# Patient Record
Sex: Male | Born: 1985 | Race: Black or African American | Hispanic: No | Marital: Single | Smoking: Never smoker
Health system: Southern US, Community
[De-identification: ages and names within clinical notes are randomized; demographics above are authoritative.]

## PROBLEM LIST (undated history)

## (undated) DIAGNOSIS — F32A Depression, unspecified: Secondary | ICD-10-CM

## (undated) DIAGNOSIS — F840 Autistic disorder: Secondary | ICD-10-CM

## (undated) DIAGNOSIS — F909 Attention-deficit hyperactivity disorder, unspecified type: Secondary | ICD-10-CM

## (undated) DIAGNOSIS — Z973 Presence of spectacles and contact lenses: Secondary | ICD-10-CM

## (undated) DIAGNOSIS — J45909 Unspecified asthma, uncomplicated: Secondary | ICD-10-CM

## (undated) DIAGNOSIS — Z8679 Personal history of other diseases of the circulatory system: Secondary | ICD-10-CM

## (undated) DIAGNOSIS — R569 Unspecified convulsions: Secondary | ICD-10-CM

## (undated) DIAGNOSIS — I10 Essential (primary) hypertension: Secondary | ICD-10-CM

## (undated) DIAGNOSIS — F329 Major depressive disorder, single episode, unspecified: Secondary | ICD-10-CM

## (undated) DIAGNOSIS — N62 Hypertrophy of breast: Secondary | ICD-10-CM

## (undated) HISTORY — DX: Major depressive disorder, single episode, unspecified: F32.9

## (undated) HISTORY — PX: CIRCUMCISION: SHX1350

## (undated) HISTORY — DX: Presence of spectacles and contact lenses: Z97.3

## (undated) HISTORY — PX: MEATOTOMY: SHX5133

## (undated) HISTORY — DX: Depression, unspecified: F32.A

## (undated) HISTORY — DX: Attention-deficit hyperactivity disorder, unspecified type: F90.9

## (undated) HISTORY — DX: Unspecified asthma, uncomplicated: J45.909

---
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
## 2005-04-13 ENCOUNTER — Encounter: Admission: RE | Admit: 2005-04-13 | Discharge: 2005-04-13 | Payer: Self-pay | Admitting: Sports Medicine

## 2005-04-13 ENCOUNTER — Ambulatory Visit: Payer: Self-pay | Admitting: Family Medicine

## 2006-09-19 ENCOUNTER — Emergency Department (HOSPITAL_COMMUNITY): Admission: EM | Admit: 2006-09-19 | Discharge: 2006-09-19 | Payer: Self-pay | Admitting: Emergency Medicine

## 2007-02-13 DIAGNOSIS — F909 Attention-deficit hyperactivity disorder, unspecified type: Secondary | ICD-10-CM | POA: Insufficient documentation

## 2007-02-13 DIAGNOSIS — J45909 Unspecified asthma, uncomplicated: Secondary | ICD-10-CM | POA: Insufficient documentation

## 2007-02-13 DIAGNOSIS — L708 Other acne: Secondary | ICD-10-CM | POA: Insufficient documentation

## 2007-02-13 DIAGNOSIS — H539 Unspecified visual disturbance: Secondary | ICD-10-CM

## 2010-02-03 ENCOUNTER — Emergency Department (HOSPITAL_COMMUNITY): Admission: EM | Admit: 2010-02-03 | Discharge: 2010-02-03 | Payer: Self-pay | Admitting: Emergency Medicine

## 2011-01-16 IMAGING — CT CT ABD-PELV W/ CM
1 series · 16 of 32 positions shown, 20 images · IV contrast (agent unspecified)
Comparison: None

CLINICAL DATA: Nausea vomiting and diarrhea.

CT ABDOMEN AND PELVIS WITH CONTRAST
TECHNIQUE: Multidetector CT imaging of the abdomen and pelvis was
performed following the standard protocol during bolus
administration of intravenous contrast.
Contrast: 100 mlOmnipaque 300

[Series 2: rtn ap with st · axial · 0.74mm/px · z∈[-477,-22]mm · 16 of 102 slices shown, 20 images]
[im 7/102  soft-tissue]
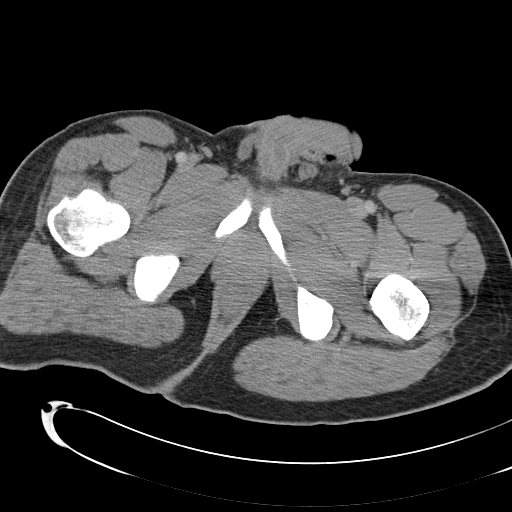
[im 7/102  bone]
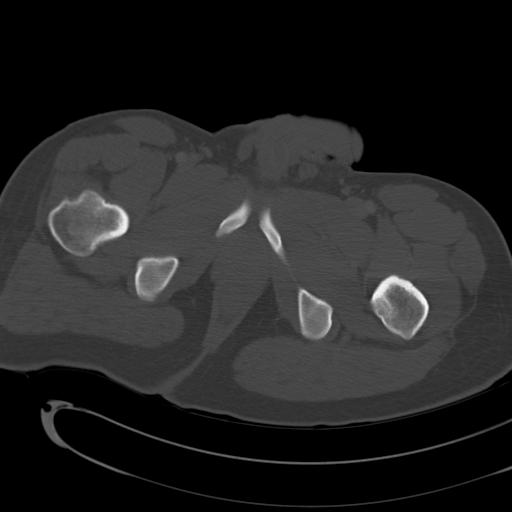
[im 14/102  soft-tissue]
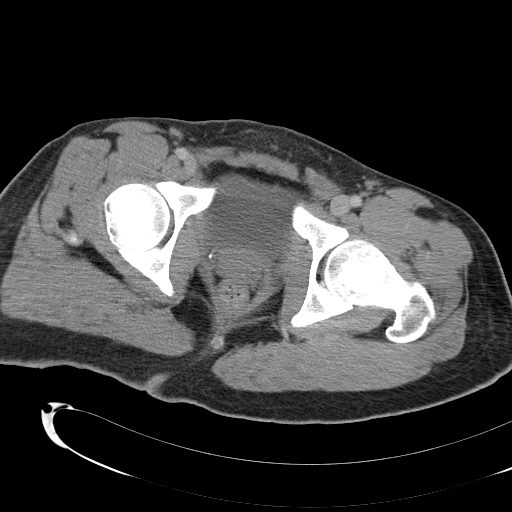
[im 20/102  soft-tissue]
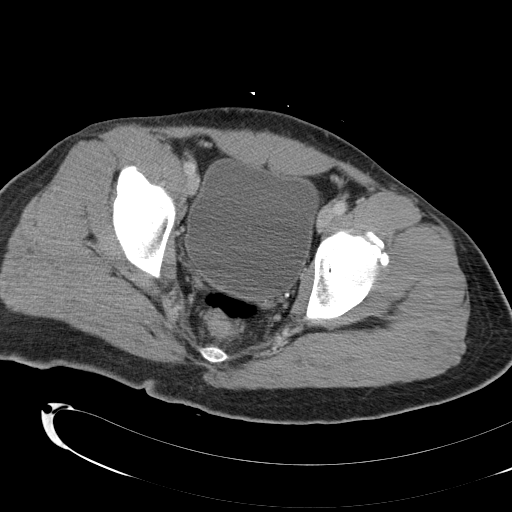
[im 27/102  soft-tissue]
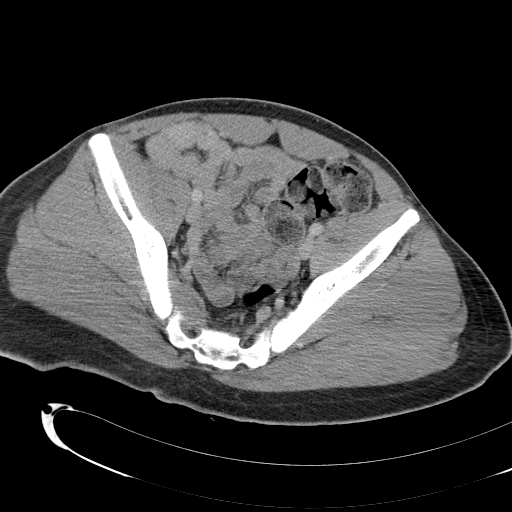
[im 33/102  soft-tissue]
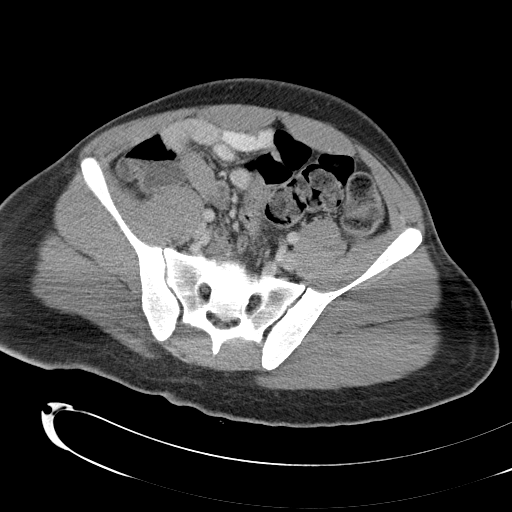
[im 40/102  soft-tissue]
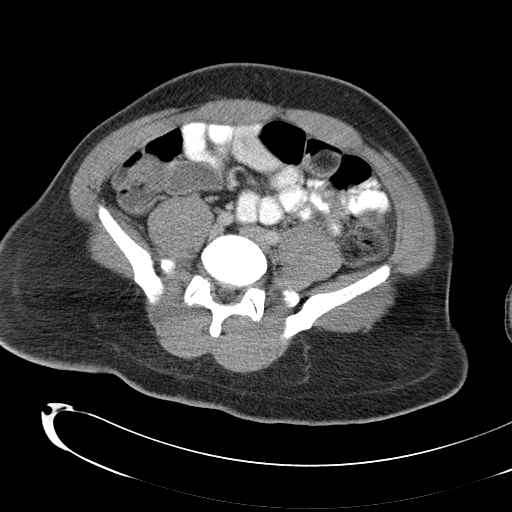
[im 46/102  soft-tissue]
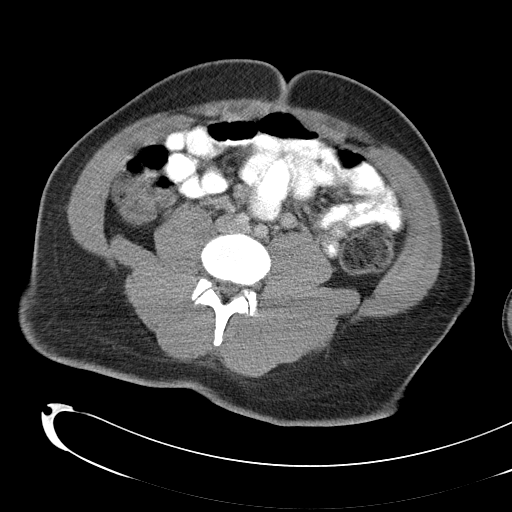
[im 56/102  soft-tissue]
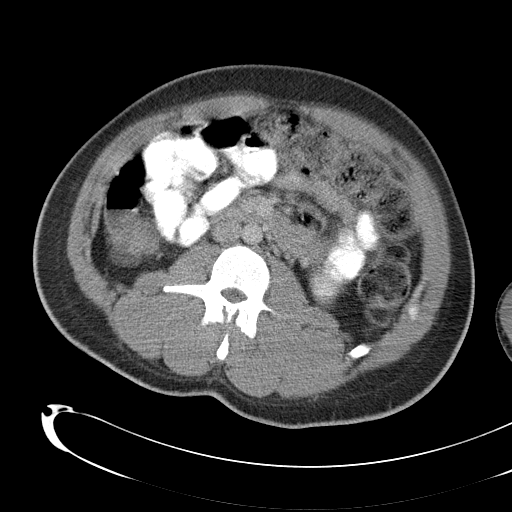
[im 62/102  soft-tissue]
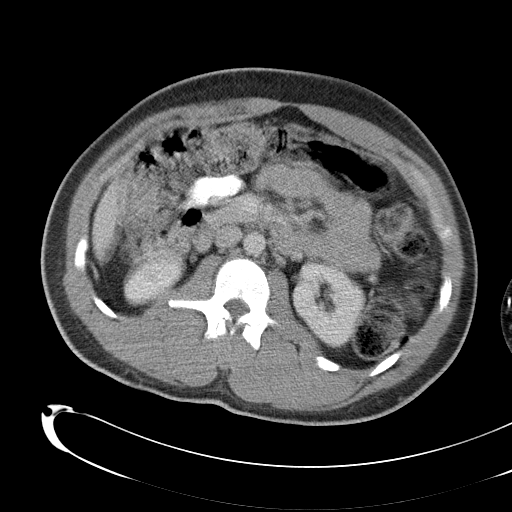
[im 62/102  bone]
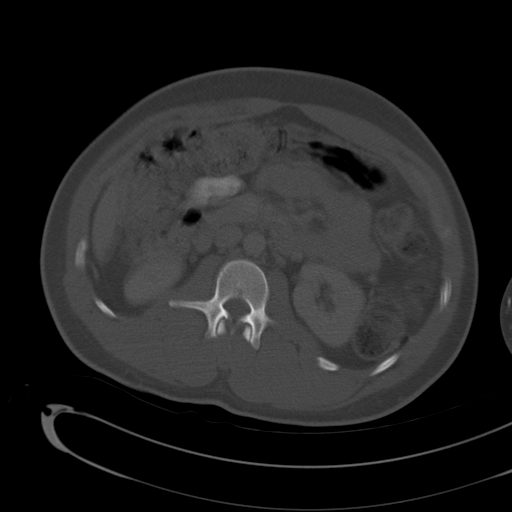
[im 69/102  soft-tissue]
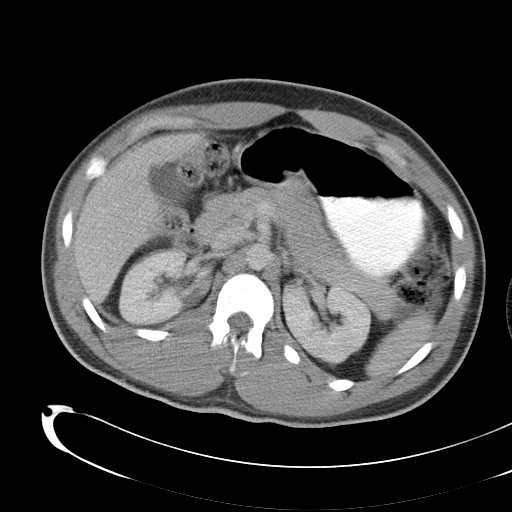
[im 75/102  soft-tissue]
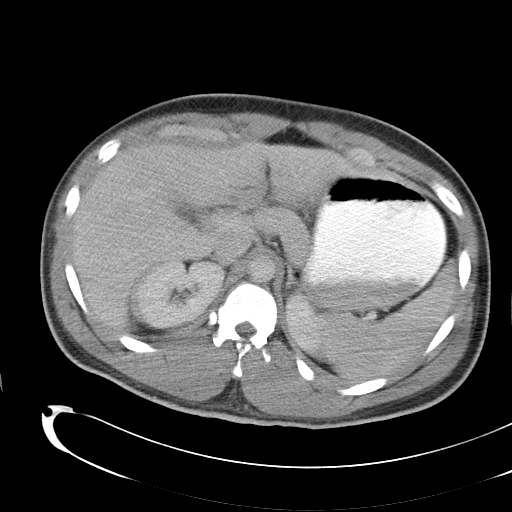
[im 82/102  soft-tissue]
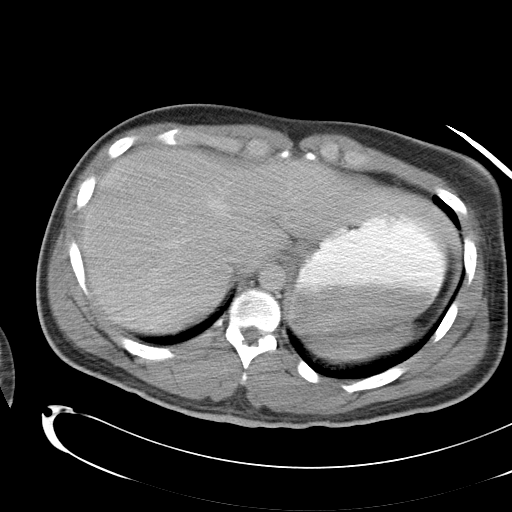
[im 88/102  soft-tissue]
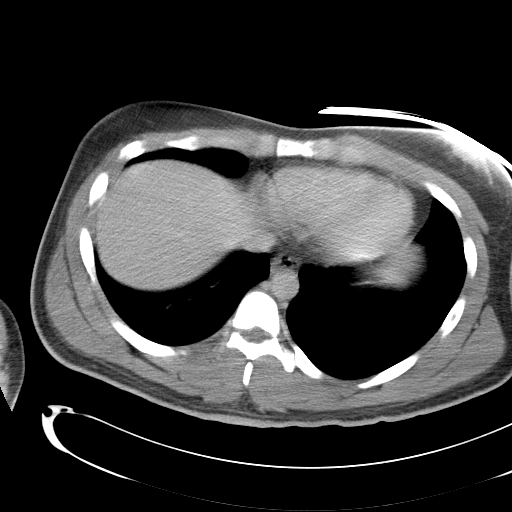
[im 88/102  lung]
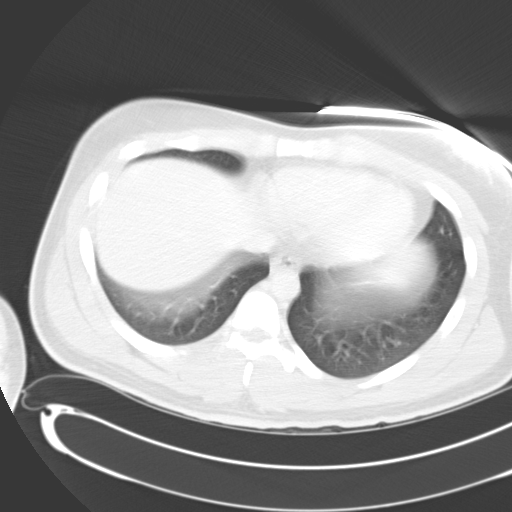
[im 92/102  lung]
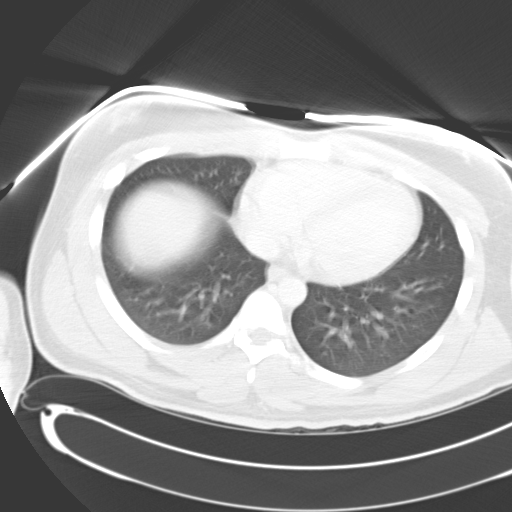
[im 95/102  soft-tissue]
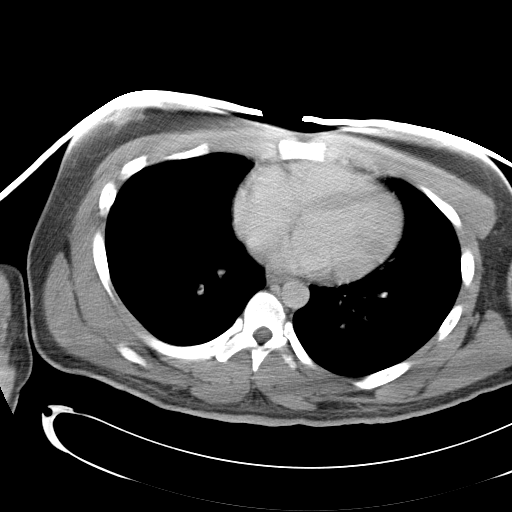
[im 95/102  lung]
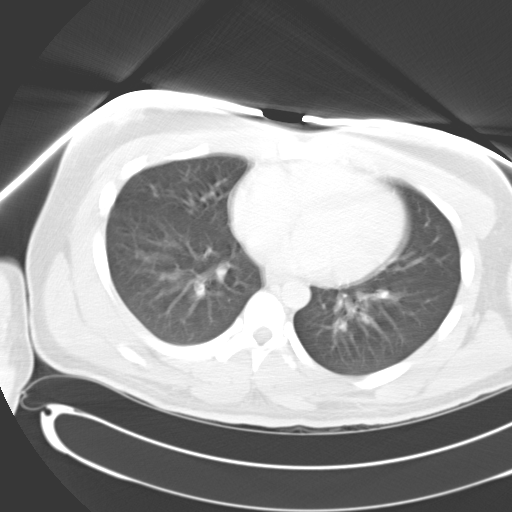
[im 98/102  lung]
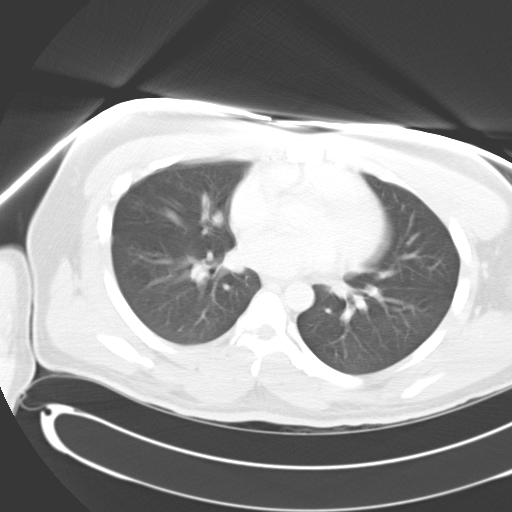

[16 of 32 positions shown; findings below may reference images not displayed]

FINDINGS: Lung bases are clear.  No focal hepatic lesion.  The
gallbladder, pancreas, spleen, adrenal glands, and kidneys appear
normal.

The stomach, small bowel, and cecum appear normal.  Appendix is not
clearly identified.  No peri cecal inflammation.  Moderate volume
stool throughout the entirety the colon.

Abdominal aorta is normal caliber.  No evidence of abdominal
lymphadenopathy.

There is no free fluid in the pelvis.  The bladder appears normal.
Prostate appears normal.  No evidence of pelvic lymphadenopathy.
Review of  bone windows demonstrates no aggressive osseous lesions.
IMPRESSION: 1.  No acute abdominal or pelvic findings.
2.  Moderate volume of  stool throughout the colon could represent
mild constipation.

## 2011-03-08 LAB — CBC
HCT: 46.4 % (ref 39.0–52.0)
Hemoglobin: 15.6 g/dL (ref 13.0–17.0)
MCHC: 33.7 g/dL (ref 30.0–36.0)
MCV: 93.6 fL (ref 78.0–100.0)
Platelets: 151 10*3/uL (ref 150–400)
RBC: 4.96 MIL/uL (ref 4.22–5.81)
RDW: 12 % (ref 11.5–15.5)
WBC: 6.5 10*3/uL (ref 4.0–10.5)

## 2011-03-08 LAB — URINALYSIS, ROUTINE W REFLEX MICROSCOPIC
Bilirubin Urine: NEGATIVE
Glucose, UA: NEGATIVE mg/dL
Hgb urine dipstick: NEGATIVE
Ketones, ur: NEGATIVE mg/dL
Nitrite: NEGATIVE
Protein, ur: NEGATIVE mg/dL
Specific Gravity, Urine: 1.026 (ref 1.005–1.030)
Urobilinogen, UA: 1 mg/dL (ref 0.0–1.0)
pH: 7.5 (ref 5.0–8.0)

## 2011-03-08 LAB — DIFFERENTIAL
Lymphs Abs: 0.3 10*3/uL — ABNORMAL LOW (ref 0.7–4.0)
Monocytes Relative: 8 % (ref 3–12)
Neutro Abs: 5.6 10*3/uL (ref 1.7–7.7)
Neutrophils Relative %: 87 % — ABNORMAL HIGH (ref 43–77)

## 2011-03-08 LAB — COMPREHENSIVE METABOLIC PANEL
ALT: 9 U/L (ref 0–53)
BUN: 8 mg/dL (ref 6–23)
Calcium: 9.3 mg/dL (ref 8.4–10.5)
Glucose, Bld: 105 mg/dL — ABNORMAL HIGH (ref 70–99)
Sodium: 138 mEq/L (ref 135–145)
Total Protein: 7.7 g/dL (ref 6.0–8.3)
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
## 2012-07-01 ENCOUNTER — Ambulatory Visit (INDEPENDENT_AMBULATORY_CARE_PROVIDER_SITE_OTHER): Payer: Medicare Other | Admitting: Family Medicine

## 2012-07-01 VITALS — BP 141/92 | HR 97 | Temp 97.5°F | Ht 69.0 in | Wt 234.5 lb

## 2012-07-01 DIAGNOSIS — I10 Essential (primary) hypertension: Secondary | ICD-10-CM | POA: Insufficient documentation

## 2012-07-01 DIAGNOSIS — R030 Elevated blood-pressure reading, without diagnosis of hypertension: Secondary | ICD-10-CM

## 2012-07-01 DIAGNOSIS — F913 Oppositional defiant disorder: Secondary | ICD-10-CM

## 2012-07-01 DIAGNOSIS — Z0000 Encounter for general adult medical examination without abnormal findings: Secondary | ICD-10-CM | POA: Insufficient documentation

## 2012-07-01 DIAGNOSIS — F909 Attention-deficit hyperactivity disorder, unspecified type: Secondary | ICD-10-CM

## 2012-07-01 DIAGNOSIS — Z79899 Other long term (current) drug therapy: Secondary | ICD-10-CM | POA: Insufficient documentation

## 2012-07-01 DIAGNOSIS — J45909 Unspecified asthma, uncomplicated: Secondary | ICD-10-CM

## 2012-07-01 DIAGNOSIS — E8881 Metabolic syndrome: Secondary | ICD-10-CM | POA: Insufficient documentation

## 2012-07-01 LAB — COMPREHENSIVE METABOLIC PANEL
Albumin: 4.3 g/dL (ref 3.5–5.2)
CO2: 26 mEq/L (ref 19–32)
Chloride: 105 mEq/L (ref 96–112)
Glucose, Bld: 91 mg/dL (ref 70–99)
Potassium: 3.7 mEq/L (ref 3.5–5.3)
Sodium: 142 mEq/L (ref 135–145)
Total Protein: 7.2 g/dL (ref 6.0–8.3)

## 2012-07-01 LAB — CBC WITH DIFFERENTIAL/PLATELET
Basophils Absolute: 0 10*3/uL (ref 0.0–0.1)
Eosinophils Absolute: 0.1 10*3/uL (ref 0.0–0.7)
Lymphocytes Relative: 31 % (ref 12–46)
Lymphs Abs: 1.6 10*3/uL (ref 0.7–4.0)
Neutrophils Relative %: 56 % (ref 43–77)
Platelets: 200 10*3/uL (ref 150–400)
RBC: 4.69 MIL/uL (ref 4.22–5.81)
WBC: 5.2 10*3/uL (ref 4.0–10.5)

## 2012-07-02 ENCOUNTER — Encounter: Payer: Self-pay | Admitting: Family Medicine

## 2012-07-02 DIAGNOSIS — F912 Conduct disorder, adolescent-onset type: Secondary | ICD-10-CM | POA: Insufficient documentation

## 2012-07-02 LAB — VALPROIC ACID LEVEL: Valproic Acid Lvl: 68 ug/mL (ref 50.0–100.0)

## 2012-07-02 NOTE — Progress Notes (Signed)
Subjective:    Patient ID: Dylan Frank, male    DOB: 31-Jul-1986, 26 y.o.   MRN: [id number]  HPI  New patient in for check up---meet new doctor. Here with Mom Sanjuana Letters ) who is a long time patient in my practice.  Sees Brock Bad at [facility] for med management for behavioral issues and ADHD. Only issue is that he was fairly recently decreased from 2 tabs methylphenidat (40 mg SR) in am to one tab (20 mg). Mom and he agree that he gets much more restless in afternoon on thislower dose. Difficulty focusing. Does interfere with him getting things doone but he does not report fatigue, headache or agitation. Mom says he is a little more irritable on the decreased dose,  During school, he works at [employer] at [employer] in afternoons and enjoys this.  Hx of asthma---never used any more that albuterol. Not currently suing at all. Not sure if he has an inhaler at home.  Review of Systems  Constitutional: Negative for fever, appetite change, fatigue and unexpected weight change.  HENT: Negative for congestion and rhinorrhea.   Eyes: Negative for pain and visual disturbance.  Respiratory: Negative for cough and shortness of breath.   Cardiovascular: Negative for chest pain.  Genitourinary: Negative for dysuria, difficulty urinating, penile pain and testicular pain.  Skin: Negative for rash.  Neurological: Negative for dizziness, weakness and light-headedness.  Psychiatric/Behavioral: Positive for decreased concentration. Negative for suicidal ideas, hallucinations, behavioral problems, confusion, disturbed wake/sleep cycle and agitation. The patient is hyperactive.        Objective:   Physical Exam  Constitutional: He is oriented to person, place, and time. He appears well-developed and well-nourished.  HENT:  Head: Normocephalic and atraumatic.  Right Ear: External ear normal.  Left Ear: External ear normal.  Nose: Nose normal.  Mouth/Throat: Oropharynx is clear and moist.  Eyes:  Conjunctivae and EOM are normal. Pupils are equal, round, and reactive to light. No scleral icterus.  Neck: Normal range of motion. Neck supple. No thyromegaly present.  Cardiovascular: Normal rate, regular rhythm, normal heart sounds and intact distal pulses.   Pulmonary/Chest: Effort normal and breath sounds normal. He has no wheezes.  Abdominal: Soft. Bowel sounds are normal. He exhibits no mass. There is no tenderness. There is no rebound.  Genitourinary: Penis normal.       Circumcised Testes descended bilaterally No penile lesions  Musculoskeletal: Normal range of motion.  Lymphadenopathy:    He has no cervical adenopathy.  Neurological: He is alert and oriented to person, place, and time. He has normal reflexes.  Skin: No rash noted.  Psychiatric:       Co-operative. AxO x4. Mildly flat affect. Answers questions appropriately, briefly. No spontaneous conversation.  Denies hallucination. Speech content normal          Assessment & Plan:

## 2012-07-02 NOTE — Assessment & Plan Note (Signed)
Wuill refill as I think he should have albuterol inhaler on hand

## 2012-07-30 ENCOUNTER — Other Ambulatory Visit: Payer: Self-pay | Admitting: Family Medicine

## 2012-07-30 MED ORDER — ALBUTEROL SULFATE HFA 108 (90 BASE) MCG/ACT IN AERS: 2.0000 | INHALATION_SPRAY | Freq: Four times a day (QID) | RESPIRATORY_TRACT | Status: DC | PRN

## 2012-10-22 ENCOUNTER — Ambulatory Visit (INDEPENDENT_AMBULATORY_CARE_PROVIDER_SITE_OTHER): Payer: Medicare Other | Admitting: Family Medicine

## 2012-10-22 ENCOUNTER — Encounter: Payer: Self-pay | Admitting: Family Medicine

## 2012-10-22 VITALS — BP 140/90 | HR 97 | Temp 98.0°F | Ht 69.5 in | Wt 242.7 lb

## 2012-10-22 DIAGNOSIS — Z23 Encounter for immunization: Secondary | ICD-10-CM

## 2012-10-22 DIAGNOSIS — I10 Essential (primary) hypertension: Secondary | ICD-10-CM

## 2012-10-22 MED ORDER — LISINOPRIL 10 MG PO TABS: 10.0000 mg | ORAL_TABLET | Freq: Every day | ORAL | Status: DC

## 2012-10-22 NOTE — Progress Notes (Signed)
Subjective:    Patient ID: Dylan Frank, male    DOB: [birth year], 26 y.o.   MRN: [id number]  HPI  Followup elevated blood pressure. He's been trying to lose weight but has actually gained 4 pounds. Denies chest pains, shortness of breath, lower extremity edema. He is not very active although he does walk a little bit every day from his house to the bus stop.  Review of Systems See history of present illness    Objective:   Physical Exam General: Well-developed male no acute distress Cardiovascular: Regular rate and rhythm no murmur gallop or rub LUNGS: Clear to auscultationbi laterally EXTREMITY: No edema Of extremity x4.      Assessment & Plan:

## 2012-10-22 NOTE — Patient Instructions (Signed)
Your blood pressure is still in the elevated range. I think it would be a really good idea to go ahead and start your low-dose blood pressure medicine. I have sent a prescription in for that. I don't think he'll notice any side effects at all. The most common side effect with this, medicine is to develop a chronic cough. If he started noticing that, a cough every day of full times a day, and let me know. I like to see you back in about a months to recheck her blood pressure.  Regarding your early morning restlessness, I would urge her to call your  psychiatrist and discussion medications.  He has put on about 4 pounds since I saw it last. I would add a little bit more exercise during the day and littlest pizza. Great to see you.

## 2012-10-22 NOTE — Assessment & Plan Note (Signed)
I looked at some outside blood pressure readings he had. He is consistently in the 140s for his systolic. Diastolic is using hat 80s to 90s. We'll start him on low-dose ACE inhibitor and see him back in 4 weeks.

## 2012-11-26 ENCOUNTER — Encounter: Payer: Self-pay | Admitting: Family Medicine

## 2012-11-26 ENCOUNTER — Ambulatory Visit (INDEPENDENT_AMBULATORY_CARE_PROVIDER_SITE_OTHER): Payer: Medicare Other | Admitting: Family Medicine

## 2012-11-26 VITALS — BP 130/77 | HR 100 | Temp 98.0°F | Ht 69.0 in | Wt 241.3 lb

## 2012-11-26 DIAGNOSIS — I10 Essential (primary) hypertension: Secondary | ICD-10-CM

## 2012-11-26 LAB — BASIC METABOLIC PANEL
Chloride: 105 mEq/L (ref 96–112)
Creat: 1.02 mg/dL (ref 0.50–1.35)

## 2012-11-26 NOTE — Progress Notes (Signed)
Subjective:    Patient ID: Dylan Frank, male    DOB: 1986/10/02, 26 y.o.   MRN: [id number]  HPI  Followup starting antihypertensive medication. No problems. Specifically denies cough or swelling.  Review of Systems See history of present illness.    Objective:   Physical Exam  Vital signs are reviewed. Notably his blood pressure is much improved. GENERAL: Well-developed male no acute distress CARDIO vascular regular rate and rhythm EXTREMITY: No edema LUNGS: Clear to auscultation bilaterally      Assessment & Plan:

## 2012-11-26 NOTE — Assessment & Plan Note (Signed)
Doing well on initial blood pressure medicine. I will check his kidney function and his electrolytes today and then follow him up in 3-4 months. Sooner if problems.

## 2012-12-02 ENCOUNTER — Encounter: Payer: Self-pay | Admitting: Family Medicine

## 2013-02-04 ENCOUNTER — Ambulatory Visit: Payer: Medicare Other | Admitting: Family Medicine

## 2013-02-11 ENCOUNTER — Encounter: Payer: Self-pay | Admitting: Family Medicine

## 2013-02-11 ENCOUNTER — Ambulatory Visit (INDEPENDENT_AMBULATORY_CARE_PROVIDER_SITE_OTHER): Payer: Medicare Other | Admitting: Family Medicine

## 2013-02-11 ENCOUNTER — Other Ambulatory Visit: Payer: Self-pay | Admitting: Family Medicine

## 2013-02-11 VITALS — BP 134/83 | HR 95 | Temp 98.0°F | Ht 69.0 in | Wt 247.2 lb

## 2013-02-11 DIAGNOSIS — I10 Essential (primary) hypertension: Secondary | ICD-10-CM

## 2013-02-11 DIAGNOSIS — N62 Hypertrophy of breast: Secondary | ICD-10-CM | POA: Insufficient documentation

## 2013-02-11 DIAGNOSIS — E8881 Metabolic syndrome: Secondary | ICD-10-CM

## 2013-02-11 DIAGNOSIS — E229 Hyperfunction of pituitary gland, unspecified: Secondary | ICD-10-CM

## 2013-02-11 DIAGNOSIS — Z79899 Other long term (current) drug therapy: Secondary | ICD-10-CM

## 2013-02-11 LAB — TESTOSTERONE: Testosterone: 191 ng/dL — ABNORMAL LOW (ref 300–890)

## 2013-02-11 LAB — COMPREHENSIVE METABOLIC PANEL
Alkaline Phosphatase: 45 U/L (ref 39–117)
BUN: 14 mg/dL (ref 6–23)
CO2: 26 mEq/L (ref 19–32)
Creat: 1.24 mg/dL (ref 0.50–1.35)
Glucose, Bld: 97 mg/dL (ref 70–99)
Total Bilirubin: 0.5 mg/dL (ref 0.3–1.2)

## 2013-02-11 LAB — CBC WITH DIFFERENTIAL/PLATELET
Eosinophils Absolute: 0.1 10*3/uL (ref 0.0–0.7)
Hemoglobin: 14.1 g/dL (ref 13.0–17.0)
Lymphocytes Relative: 35 % (ref 12–46)
Lymphs Abs: 2 10*3/uL (ref 0.7–4.0)
MCH: 30.3 pg (ref 26.0–34.0)
Monocytes Relative: 15 % — ABNORMAL HIGH (ref 3–12)
Neutro Abs: 2.8 10*3/uL (ref 1.7–7.7)
Neutrophils Relative %: 49 % (ref 43–77)
RBC: 4.66 MIL/uL (ref 4.22–5.81)
WBC: 5.7 10*3/uL (ref 4.0–10.5)

## 2013-02-11 LAB — LIPID PANEL
Cholesterol: 151 mg/dL (ref 0–200)
HDL: 37 mg/dL — ABNORMAL LOW (ref >39)
Total CHOL/HDL Ratio: 4.1 Ratio
Triglycerides: 133 mg/dL (ref <150)
VLDL: 27 mg/dL (ref 0–40)

## 2013-02-11 LAB — TSH: TSH: 1.919 u[IU]/mL (ref 0.350–4.500)

## 2013-02-11 NOTE — Addendum Note (Signed)
Addended by: Herminio Heads on: 02/11/2013 09:38 AM   Modules accepted: Orders

## 2013-02-11 NOTE — Assessment & Plan Note (Signed)
Doing well on meds. Check labs today return to clinic 4 months.

## 2013-02-11 NOTE — Assessment & Plan Note (Signed)
I'll be happy to check the lab work requested by his psychiatric team and fax results to them.

## 2013-02-11 NOTE — Progress Notes (Signed)
Subjective:    Patient ID: Dylan Frank, male    DOB: 22-Sep-1986, 27 y.o.   MRN: [id number]  HPI  #1. Hypertension: Do well with his medicines. No problems. No cough. Blood pressure seems well controlled. #2. Psychiatric issues: Continues to be followed by [facility]. They wish me to get some lab work for them today. #3. Complaint of breast enlargement. He's really noticed it over the last 5-6 months but it has been increasing for some time. Denies breast discharge. He has generally gained weight over the last 5 years within has not been any recent weight gain. He has been on several psychiatric medicines long-term and will likely be on these lifetime. No family history of breast cancer in first degree relative and no family history of early breast cancer that he is aware of.  Review of Systems See history of present illness.    Objective:   Physical Exam Vital signs are reviewed GENERAL: Well-developed overweight male no acute distress CHEST: Bilaterally he has macromastia. The axilla is benign. CARDIOVASCULAR: Regular rate and rhythm EXTREMITY: No edema NECK: No lymphadenopathy no thyromegaly.       Assessment & Plan:

## 2013-02-12 ENCOUNTER — Other Ambulatory Visit: Payer: Self-pay | Admitting: Family Medicine

## 2013-02-12 DIAGNOSIS — N62 Hypertrophy of breast: Secondary | ICD-10-CM

## 2013-02-12 NOTE — Addendum Note (Signed)
Addended by: Damita Lack on: 02/12/2013 11:45 AM   Modules accepted: Orders

## 2013-02-19 ENCOUNTER — Encounter: Payer: Self-pay | Admitting: Family Medicine

## 2013-02-26 ENCOUNTER — Encounter: Payer: Self-pay | Admitting: Family Medicine

## 2013-02-26 NOTE — Progress Notes (Signed)
Patient ID: Dylan Frank, male   DOB: [birth year], 27 y.o.   MRN: [id number] We looked at the antipsychotic question RE testosterone effect.  There appears to be an effect with antipsycotic meds and hormones.   Testosterone changes have been observed but the effect is variable.     Suggesting a "best option" for your patient is difficult.   The table on the last page below may lead [place] to consider the use of Abilify (aripiprazole) as an alternative.     Hope this helps.     Antipsychotic-Induced Hyperprolactinemia and Testosterone Levels in Boys Roke Y.  Zenaida Niece Harten P.N.  Buitelaar J.K.  Tenback D.E.  de Rijke Y.B.  Boot A.M.  Horm Res Paediatr 2012;77:235-240 (DOI: 10.1159/000337910)  Abstract Aims: This cross-sectional study investigates the effect of antipsychotic (AP)-induced hyperprolactinemia on testosterone, luteinizing hormone (LH), follicle-stimulating hormone (FSH), inhibin B, and puberty in boys with mainly autism spectrum disorders (ASD). Method: One hundred and four physically healthy 21- to 27 year old boys with ASD or disruptive behavior disorder (DBD) were recruited between October 2006 and November 2009. Fifty-six adolescents had been treated with AP for >16 months; 48 had never been exposed to AP. Morning non-fasting levels of serum prolactin, testosterone, LH, FSH and inhibin B were obtained and Tanner pubertal stage was determined. Patients with hyperprolactinemia (n = 28) were compared to those without hyperprolactinemia (n = 76) using non-parametric or parametric tests, as appropriate. Results: Patients with AP-induced hyperprolactinemia had significantly lower testosterone levels with adjustment for age (p = 0.035) compared to patients without hyperprolactinemia and without AP treatment. The difference was not significant within the AP-treated group, and the level of testosterone was within the reference range compared to age- and gender-matched normative data. There was no  between-group difference for LH, FSH, inhibin B or Tanner stages. 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  Short-term testosterone augmentation in male schizophrenics: a randomized, double-blind, placebo-controlled trial. Desma Mcgregor, Darral Dash, [place], Joe SH, [facility], Jung HG, [place] MS - J Clin Psychopharmacol - Aug 2008; 28(4); 409-81 Abstract Although there are few studies on the treatment of schizophrenia with testosterone, several indirect findings have suggested testosterone as a possible treatment modality for schizophrenia. To explore the therapeutic effect of testosterone augmentation of antipsychotic medication on symptoms in male patients with schizophrenia, the authors performed a placebo-controlled, double-blind trial on 30 schizophrenic men, using either 5 g of 1% testosterone gel (Testogel; Besins [place], [place], [place]) or a placebo added to a fixed dosage of antipsychotic medication over a period of 4 weeks with a 2-week washout period. In addition, to get additional information about the involvement of these reproductive hormones after testosterone augmentation, the authors evaluated several hormones such as total testosterone, free testosterone, dehydroepiandrosterone sulfate, estradiol, and prolactin. Results indicated a significant improvement of negative symptoms in both the last observation carried forward and the completer analyses and a nonsignificant trend for the improvement of depressive symptoms in completers. There were no significant changes in serum hormone levels except total and free testosterone. The findings of this study suggest that testosterone augmentation may be a potential therapeutic strategy in patients with schizophrenia. Citation Short-term testosterone augmentation in male schizophrenics: a randomized, double-blind, placebo-controlled trial.Ko YH, Theodosia Blender SW, Joe SH, Nunez,  Jung HG, [place] MS Evlyn Clines Psychopharmacol - Aug 2008; 28(4); 191-47  MEDLINE is the source for the citation and abstract for this record  The  following may be least important Effects of psychiatric disorders and psychotropic medications on prolactin and bone metabolism. Venetia Constable, Papakostas Marland [facility] Psychiatry - Dec 2004; 65(12); 1610-96; quiz [phone], 1760-1 Abstract Osteoporosis occurs in common psychiatric conditions and causes significant morbidity. Many neuroleptic medications can cause hyperprolactinemia, which can then potentially be associated with bone loss. Few reviews have thus far addressed this issue. We have consolidated information from studies that examined effects of psychiatric conditions and their treatment on bone metabolism. We searched PubMed for original articles and reviews published between 1976 and 2004 that described changes in bone metabolism in psychiatric disorders and examined prolactin elevations with neuroleptic medications. Keywords used were major depressive disorder, bipolar disorder, schizophrenia, bone density, bone metabolism, hyperprolactinemia, typical antipsychotics, and atypical antipsychotics. 160 articles published in peer-reviewed journals were identified and are summarized, with greater emphasis given to data from larger, controlled studies. Schizophrenia and major mood disorders are often associated with perturbations in bone metabolism related to factors including nutritional alterations, smoking, and hypogonadotropic hypogonadism, with or without medication-induced hyperprolactinemia. Polydipsia can contribute to bone loss in schizophrenia, whereas hypercortisolemia is often associated with low bone density in depression. Lithium in bipolar disorder and thyroid-stimulating hormone-suppressive doses of L-thyroxine have a negative impact on bone health. Mood stabilizers such as carbamazepine and valproate can also affect bone density.  Hyperprolactinemia may lead to bone loss only if associated with untreated amenorrhea in women or testosterone deficiency in men. Some atypical neuroleptics, by causing lesser elevations in prolactin, may therefore have a less marked impact on bone than typical neuroleptics. Because significant morbidity is associated with low bone density and many psychiatric conditions may have a negative impact on bone metabolism, bone density evaluation should be considered an integral component of chronic medical care of these disorders, and risk factors should be identified and addressed. Citation Effects of psychiatric disorders and psychotropic medications on prolactin and bone metabolism.Venetia Constable, Papakostas GI, [place] [facility] Psychiatry - Dec 2004; 65(12); 0981-19; quiz [phone], 1760-1  MEDLINE is the source for the citation and abstract for this record

## 2013-02-27 ENCOUNTER — Ambulatory Visit (INDEPENDENT_AMBULATORY_CARE_PROVIDER_SITE_OTHER): Payer: Self-pay | Admitting: General Surgery

## 2013-03-12 ENCOUNTER — Telehealth: Payer: Self-pay | Admitting: *Deleted

## 2013-03-12 NOTE — Telephone Encounter (Addendum)
Received call on Physicians /Pharmacy line  from  Desiree Hane of Dr. Edilia Bo' s office  stating we have a mutual patient  that they are seeing for Gynecomastia.  . They noticed from notes that patient  has diagnosis of ADHD and Autism. Currently on  Respiradol.  They request records or a letter  as to how long patient has been on this medication  and why he is taking it.  Need information in order to get procedure scheduled.  Will forward to Dr. Jennette Kettle.   Phone # if needed [phone] or [phone].

## 2013-03-16 NOTE — Telephone Encounter (Signed)
Attempted to call [phone] not valid number. Then tried [phone] rang and rang no answer

## 2013-03-16 NOTE — Telephone Encounter (Signed)
Dear [name] Team  Please call this doctor's office and tell them: He has been on it very long term--I do not  Really know since I have been takig care of him less than a year---but he has had mental health issues since childhood---if they HAVE to know, maybe his Mom Sanjuana Letters) can tell them. The risperidol is for a mental health disorder that is handled by [facility] mental health. Doubt this helps them but all I can tell them [facility]! Denny Levy

## 2013-03-17 NOTE — Telephone Encounter (Signed)
Once again phone rang and no answer. Closing phone note due to no answer. If we do get a call back please give below message

## 2013-04-13 ENCOUNTER — Encounter (HOSPITAL_BASED_OUTPATIENT_CLINIC_OR_DEPARTMENT_OTHER): Payer: Self-pay

## 2013-04-13 ENCOUNTER — Ambulatory Visit (HOSPITAL_BASED_OUTPATIENT_CLINIC_OR_DEPARTMENT_OTHER): Admit: 2013-04-13 | Payer: Medicare Other | Admitting: Specialist

## 2013-04-13 SURGERY — MAMMOPLASTY, REDUCTION
Anesthesia: General | Laterality: Bilateral

## 2013-05-17 DIAGNOSIS — N62 Hypertrophy of breast: Secondary | ICD-10-CM

## 2013-05-17 HISTORY — DX: Hypertrophy of breast: N62

## 2013-05-18 ENCOUNTER — Encounter (HOSPITAL_BASED_OUTPATIENT_CLINIC_OR_DEPARTMENT_OTHER): Payer: Self-pay | Admitting: *Deleted

## 2013-05-18 NOTE — Pre-Procedure Instructions (Signed)
To come for EKG 

## 2013-05-20 ENCOUNTER — Encounter (HOSPITAL_BASED_OUTPATIENT_CLINIC_OR_DEPARTMENT_OTHER)
Admission: RE | Admit: 2013-05-20 | Discharge: 2013-05-20 | Disposition: A | Discharge status: Home / Self Care | Payer: Medicare Other | Source: Ambulatory Visit | Attending: Specialist | Admitting: Specialist

## 2013-05-20 ENCOUNTER — Other Ambulatory Visit: Payer: Self-pay

## 2013-05-25 ENCOUNTER — Encounter (HOSPITAL_BASED_OUTPATIENT_CLINIC_OR_DEPARTMENT_OTHER)
Admission: RE | Disposition: A | Discharge status: Home / Self Care | Payer: Self-pay | Source: Ambulatory Visit | Attending: Specialist

## 2013-05-25 ENCOUNTER — Ambulatory Visit (HOSPITAL_BASED_OUTPATIENT_CLINIC_OR_DEPARTMENT_OTHER): Payer: Medicare Other | Admitting: Anesthesiology

## 2013-05-25 ENCOUNTER — Encounter (HOSPITAL_BASED_OUTPATIENT_CLINIC_OR_DEPARTMENT_OTHER): Payer: Self-pay | Admitting: Anesthesiology

## 2013-05-25 ENCOUNTER — Ambulatory Visit (HOSPITAL_BASED_OUTPATIENT_CLINIC_OR_DEPARTMENT_OTHER)
Admission: RE | Admit: 2013-05-25 | Discharge: 2013-05-26 | Disposition: A | Discharge status: Home / Self Care | Payer: Medicare Other | Source: Ambulatory Visit | Attending: Specialist | Admitting: Specialist

## 2013-05-25 ENCOUNTER — Encounter (HOSPITAL_BASED_OUTPATIENT_CLINIC_OR_DEPARTMENT_OTHER): Payer: Self-pay | Admitting: *Deleted

## 2013-05-25 DIAGNOSIS — F3289 Other specified depressive episodes: Secondary | ICD-10-CM | POA: Insufficient documentation

## 2013-05-25 DIAGNOSIS — F329 Major depressive disorder, single episode, unspecified: Secondary | ICD-10-CM | POA: Insufficient documentation

## 2013-05-25 DIAGNOSIS — T43505A Adverse effect of unspecified antipsychotics and neuroleptics, initial encounter: Secondary | ICD-10-CM | POA: Insufficient documentation

## 2013-05-25 DIAGNOSIS — Z01810 Encounter for preprocedural cardiovascular examination: Secondary | ICD-10-CM | POA: Insufficient documentation

## 2013-05-25 DIAGNOSIS — F840 Autistic disorder: Secondary | ICD-10-CM | POA: Insufficient documentation

## 2013-05-25 DIAGNOSIS — J45909 Unspecified asthma, uncomplicated: Secondary | ICD-10-CM | POA: Insufficient documentation

## 2013-05-25 DIAGNOSIS — Y929 Unspecified place or not applicable: Secondary | ICD-10-CM | POA: Insufficient documentation

## 2013-05-25 DIAGNOSIS — I10 Essential (primary) hypertension: Secondary | ICD-10-CM | POA: Insufficient documentation

## 2013-05-25 DIAGNOSIS — F909 Attention-deficit hyperactivity disorder, unspecified type: Secondary | ICD-10-CM | POA: Insufficient documentation

## 2013-05-25 DIAGNOSIS — N62 Hypertrophy of breast: Secondary | ICD-10-CM | POA: Insufficient documentation

## 2013-05-25 DIAGNOSIS — Z79899 Other long term (current) drug therapy: Secondary | ICD-10-CM | POA: Insufficient documentation

## 2013-05-25 HISTORY — DX: Essential (primary) hypertension: I10

## 2013-05-25 HISTORY — PX: BREAST REDUCTION SURGERY: SHX8

## 2013-05-25 HISTORY — DX: Personal history of other diseases of the circulatory system: Z86.79

## 2013-05-25 HISTORY — DX: Autistic disorder: F84.0

## 2013-05-25 HISTORY — DX: Hypertrophy of breast: N62

## 2013-05-25 SURGERY — BREAST REDUCTION WITH LIPOSUCTION
Anesthesia: General | Site: Breast | Laterality: Bilateral | Wound class: Clean

## 2013-05-25 MED ORDER — METHYLPHENIDATE HCL ER (OSM) 18 MG PO TBCR
18.0000 mg | EXTENDED_RELEASE_TABLET | ORAL | Status: DC
Start: 2013-05-26 — End: 2013-05-26
  Administered 2013-05-26: 18 mg via ORAL

## 2013-05-25 MED ORDER — CEFAZOLIN SODIUM 1-5 GM-% IV SOLN
1.0000 g | Freq: Three times a day (TID) | INTRAVENOUS | Status: DC
  Administered 2013-05-25 – 2013-05-26 (×3): 1 g via INTRAVENOUS

## 2013-05-25 MED ORDER — MORPHINE SULFATE 2 MG/ML IJ SOLN: 2.0000 mg | INTRAMUSCULAR | Status: DC | PRN

## 2013-05-25 MED ORDER — DEXTROSE IN LACTATED RINGERS 5 % IV SOLN
INTRAVENOUS | Status: DC
  Administered 2013-05-25: 13:00:00 via INTRAVENOUS

## 2013-05-25 MED ORDER — OXYCODONE HCL 5 MG PO TABS: 5.0000 mg | ORAL_TABLET | Freq: Once | ORAL | Status: DC | PRN

## 2013-05-25 MED ORDER — FENTANYL CITRATE 0.05 MG/ML IJ SOLN
INTRAMUSCULAR | Status: DC | PRN
  Administered 2013-05-25: 100 ug via INTRAVENOUS

## 2013-05-25 MED ORDER — MIDAZOLAM HCL 2 MG/ML PO SYRP: 12.0000 mg | ORAL_SOLUTION | Freq: Once | ORAL | Status: DC | PRN

## 2013-05-25 MED ORDER — DIVALPROEX SODIUM ER 500 MG PO TB24: 500.0000 mg | ORAL_TABLET | Freq: Every day | ORAL | Status: DC

## 2013-05-25 MED ORDER — LIDOCAINE HCL (CARDIAC) 20 MG/ML IV SOLN
INTRAVENOUS | Status: DC | PRN
  Administered 2013-05-25: 80 mg via INTRAVENOUS

## 2013-05-25 MED ORDER — DEXAMETHASONE SODIUM PHOSPHATE 4 MG/ML IJ SOLN
INTRAMUSCULAR | Status: DC | PRN
  Administered 2013-05-25: 10 mg via INTRAVENOUS

## 2013-05-25 MED ORDER — PHENYLEPHRINE HCL 10 MG/ML IJ SOLN
INTRAMUSCULAR | Status: DC | PRN
  Administered 2013-05-25: 40 ug via INTRAVENOUS

## 2013-05-25 MED ORDER — DIVALPROEX SODIUM ER 500 MG PO TB24
500.0000 mg | ORAL_TABLET | Freq: Every day | ORAL | Status: AC
  Administered 2013-05-25: 500 mg via ORAL

## 2013-05-25 MED ORDER — ALBUTEROL SULFATE HFA 108 (90 BASE) MCG/ACT IN AERS: 2.0000 | INHALATION_SPRAY | Freq: Four times a day (QID) | RESPIRATORY_TRACT | Status: DC | PRN

## 2013-05-25 MED ORDER — LACTATED RINGERS IV SOLN
INTRAVENOUS | Status: DC
  Administered 2013-05-25 (×3): via INTRAVENOUS

## 2013-05-25 MED ORDER — CEFAZOLIN SODIUM 1-5 GM-% IV SOLN: 1.0000 g | Freq: Three times a day (TID) | INTRAVENOUS | Status: DC

## 2013-05-25 MED ORDER — PHENYLEPHRINE HCL 10 MG/ML IJ SOLN
10.0000 mg | INTRAVENOUS | Status: DC | PRN
  Administered 2013-05-25: 50 ug/min via INTRAVENOUS

## 2013-05-25 MED ORDER — MIDAZOLAM HCL 2 MG/2ML IJ SOLN: 1.0000 mg | INTRAMUSCULAR | Status: DC | PRN

## 2013-05-25 MED ORDER — HYDROCODONE-ACETAMINOPHEN 5-325 MG PO TABS
1.0000 | ORAL_TABLET | ORAL | Status: DC | PRN
  Administered 2013-05-25 – 2013-05-26 (×4): 2 via ORAL

## 2013-05-25 MED ORDER — SUCCINYLCHOLINE CHLORIDE 20 MG/ML IJ SOLN
INTRAMUSCULAR | Status: DC | PRN
  Administered 2013-05-25: 100 mg via INTRAVENOUS

## 2013-05-25 MED ORDER — FENTANYL CITRATE 0.05 MG/ML IJ SOLN: 50.0000 ug | INTRAMUSCULAR | Status: DC | PRN

## 2013-05-25 MED ORDER — CEFAZOLIN SODIUM-DEXTROSE 2-3 GM-% IV SOLR
2.0000 g | INTRAVENOUS | Status: AC
  Administered 2013-05-25: 2 g via INTRAVENOUS

## 2013-05-25 MED ORDER — OXYCODONE HCL 5 MG/5ML PO SOLN: 5.0000 mg | Freq: Once | ORAL | Status: DC | PRN

## 2013-05-25 MED ORDER — ONDANSETRON HCL 4 MG/2ML IJ SOLN: 4.0000 mg | Freq: Once | INTRAMUSCULAR | Status: DC | PRN

## 2013-05-25 MED ORDER — SODIUM CHLORIDE 0.9 % IV SOLN
INTRAVENOUS | Status: DC | PRN
  Administered 2013-05-25: 09:00:00 via INTRAMUSCULAR

## 2013-05-25 MED ORDER — HYDROMORPHONE HCL PF 1 MG/ML IJ SOLN
0.2500 mg | INTRAMUSCULAR | Status: DC | PRN
  Administered 2013-05-25 (×4): 0.5 mg via INTRAVENOUS

## 2013-05-25 MED ORDER — PROPOFOL 10 MG/ML IV BOLUS
INTRAVENOUS | Status: DC | PRN
  Administered 2013-05-25: 300 mg via INTRAVENOUS

## 2013-05-25 MED ORDER — MIDAZOLAM HCL 5 MG/5ML IJ SOLN
INTRAMUSCULAR | Status: DC | PRN
  Administered 2013-05-25: 2 mg via INTRAVENOUS

## 2013-05-25 MED ORDER — QUETIAPINE FUMARATE 400 MG PO TABS
800.0000 mg | ORAL_TABLET | Freq: Every day | ORAL | Status: DC
  Administered 2013-05-25: 800 mg via ORAL

## 2013-05-25 MED ORDER — LISINOPRIL 10 MG PO TABS
10.0000 mg | ORAL_TABLET | Freq: Every day | ORAL | Status: DC
  Administered 2013-05-26: 10 mg via ORAL

## 2013-05-25 MED ORDER — ONDANSETRON HCL 4 MG/2ML IJ SOLN
INTRAMUSCULAR | Status: DC | PRN
  Administered 2013-05-25: 4 mg via INTRAVENOUS

## 2013-05-25 SURGICAL SUPPLY — 64 items
BAG DECANTER FOR FLEXI CONT (MISCELLANEOUS) ×2 IMPLANT
BENZOIN TINCTURE PRP APPL 2/3 (GAUZE/BANDAGES/DRESSINGS) ×4 IMPLANT
BINDER BREAST 3XL (BIND) ×2 IMPLANT
BLADE KNIFE  20 PERSONNA (BLADE) ×2
BLADE KNIFE 20 PERSONNA (BLADE) ×2 IMPLANT
BLADE KNIFE PERSONA 15 (BLADE) ×2 IMPLANT
CANISTER LINER 1300 C W/ELBOW (MISCELLANEOUS) ×2 IMPLANT
CANISTER SUCTION 1200CC (MISCELLANEOUS) ×2 IMPLANT
CLOTH BEACON ORANGE TIMEOUT ST (SAFETY) ×2 IMPLANT
COVER MAYO STAND STRL (DRAPES) ×2 IMPLANT
COVER TABLE BACK 60X90 (DRAPES) ×2 IMPLANT
DECANTER SPIKE VIAL GLASS SM (MISCELLANEOUS) ×4 IMPLANT
DRAIN CHANNEL 10F 3/8 F FF (DRAIN) ×4 IMPLANT
DRAIN PENROSE 1/2X12 LTX STRL (WOUND CARE) IMPLANT
DRAPE LAPAROSCOPIC ABDOMINAL (DRAPES) ×2 IMPLANT
DRSG PAD ABDOMINAL 8X10 ST (GAUZE/BANDAGES/DRESSINGS) ×8 IMPLANT
ELECT REM PT RETURN 9FT ADLT (ELECTROSURGICAL) ×2
ELECTRODE REM PT RTRN 9FT ADLT (ELECTROSURGICAL) ×1 IMPLANT
EVACUATOR SILICONE 100CC (DRAIN) ×4 IMPLANT
FILTER 7/8 IN (FILTER) ×2 IMPLANT
FILTER LIPOSUCTION (MISCELLANEOUS) IMPLANT
GAUZE XEROFORM 5X9 LF (GAUZE/BANDAGES/DRESSINGS) ×4 IMPLANT
GLOVE BIO SURGEON STRL SZ 6.5 (GLOVE) ×4 IMPLANT
GLOVE BIOGEL M STRL SZ7.5 (GLOVE) ×4 IMPLANT
GLOVE BIOGEL PI IND STRL 8 (GLOVE) ×2 IMPLANT
GLOVE BIOGEL PI INDICATOR 8 (GLOVE) ×2
GLOVE ECLIPSE 7.0 STRL STRAW (GLOVE) ×2 IMPLANT
GOWN PREVENTION PLUS XLARGE (GOWN DISPOSABLE) IMPLANT
GOWN PREVENTION PLUS XXLARGE (GOWN DISPOSABLE) ×4 IMPLANT
IV LACTATED RINGERS 1000ML (IV SOLUTION) IMPLANT
IV NS 500ML (IV SOLUTION) ×1
IV NS 500ML BAXH (IV SOLUTION) ×1 IMPLANT
NDL SAFETY ECLIPSE 18X1.5 (NEEDLE) IMPLANT
NEEDLE HYPO 18GX1.5 SHARP (NEEDLE)
NEEDLE SPNL 18GX3.5 QUINCKE PK (NEEDLE) ×2 IMPLANT
NS IRRIG 1000ML POUR BTL (IV SOLUTION) IMPLANT
PACK BASIN DAY SURGERY FS (CUSTOM PROCEDURE TRAY) ×2 IMPLANT
PAD ALCOHOL SWAB (MISCELLANEOUS) IMPLANT
PEN SKIN MARKING BROAD TIP (MISCELLANEOUS) ×2 IMPLANT
PIN SAFETY STERILE (MISCELLANEOUS) ×2 IMPLANT
SHEETING SILICONE GEL EPI DERM (MISCELLANEOUS) IMPLANT
SLEEVE SCD COMPRESS KNEE MED (MISCELLANEOUS) ×2 IMPLANT
SPECIMEN JAR MEDIUM (MISCELLANEOUS) ×4 IMPLANT
SPECIMEN JAR X LARGE (MISCELLANEOUS) IMPLANT
SPONGE GAUZE 4X4 12PLY (GAUZE/BANDAGES/DRESSINGS) ×4 IMPLANT
SPONGE LAP 18X18 X RAY DECT (DISPOSABLE) ×8 IMPLANT
STRIP SUTURE WOUND CLOSURE 1/2 (SUTURE) ×10 IMPLANT
SUT MNCRL AB 3-0 PS2 18 (SUTURE) ×12 IMPLANT
SUT MON AB 2-0 CT1 36 (SUTURE) IMPLANT
SUT MON AB 5-0 PS2 18 (SUTURE) ×4 IMPLANT
SUT PROLENE 3 0 PS 2 (SUTURE) ×8 IMPLANT
SYR 20CC LL (SYRINGE) IMPLANT
SYR 50ML LL SCALE MARK (SYRINGE) ×4 IMPLANT
SYR CONTROL 10ML LL (SYRINGE) IMPLANT
TAPE HYPAFIX 6X30 (GAUZE/BANDAGES/DRESSINGS) ×2 IMPLANT
TAPE MEASURE 72IN RETRACT (INSTRUMENTS)
TAPE MEASURE LINEN 72IN RETRCT (INSTRUMENTS) IMPLANT
TAPE PAPER MEDFIX 1IN X 10YD (GAUZE/BANDAGES/DRESSINGS) ×2 IMPLANT
TRAY DSU PREP LF (CUSTOM PROCEDURE TRAY) ×4 IMPLANT
TUBE CONNECTING 20X1/4 (TUBING) ×2 IMPLANT
TUBING SET GRADUATE ASPIR 12FT (MISCELLANEOUS) ×2 IMPLANT
UNDERPAD 30X30 INCONTINENT (UNDERPADS AND DIAPERS) ×2 IMPLANT
VAC PENCILS W/TUBING CLEAR (MISCELLANEOUS) ×2 IMPLANT
YANKAUER SUCT BULB TIP NO VENT (SUCTIONS) ×2 IMPLANT

## 2013-05-25 NOTE — Transfer of Care (Signed)
Immediate Anesthesia Transfer of Care Note  Patient: Dylan Frank  Procedure(s) Performed: Procedure(s): BILATERAL BREAST REDUCTION WITH LIPO ASSISTANCE left breast (Bilateral)  Patient Location: PACU  Anesthesia Type:General  Level of Consciousness: awake and alert   Airway & Oxygen Therapy: Patient Spontanous Breathing and Patient connected to face mask oxygen  Post-op Assessment: Report given to PACU RN and Post -op Vital signs reviewed and stable  Post vital signs: Reviewed and stable  Complications: No apparent anesthesia complications

## 2013-05-25 NOTE — Brief Op Note (Signed)
05/25/2013  9:54 AM  PATIENT:  Dylan Frank  27 y.o. male  PRE-OPERATIVE DIAGNOSIS:  GYNOCOMASTIA/MACROMASTIA BILATERAL  POST-OPERATIVE DIAGNOSIS:  GYNOCOMASTIA/MACROMASTIA BILATERAL  PROCEDURE:  Procedure(s): BILATERAL BREAST REDUCTION WITH LIPO ASSISTANCE left breast (Bilateral)  SURGEON:  Surgeon(s) and Role:    * Louisa Second, MD - Primary  PHYSICIAN ASSISTANT:   ASSISTANTS: none   ANESTHESIA:   general  EBL:  Total I/O In: 1000 [I.V.:1000] Out: -   BLOOD ADMINISTERED:none  DRAINS: (right and left lateral chest areas) Jackson-Pratt drain(s) with closed bulb suction in the right and left lateral chest areas   LOCAL MEDICATIONS USED:  LIDOCAINE   SPECIMEN:  Excision  DISPOSITION OF SPECIMEN:  PATHOLOGY  COUNTS:  YES  TOURNIQUET:  * No tourniquets in log *  DICTATION: .Other Dictation: Dictation Number [id number]  PLAN OF CARE: Admit for overnight observation  PATIENT DISPOSITION:  PACU - hemodynamically stable.   Delay start of Pharmacological VTE agent (>24hrs) due to surgical blood loss or risk of bleeding: yes

## 2013-05-25 NOTE — Anesthesia Preprocedure Evaluation (Addendum)
Anesthesia Evaluation  Patient identified by MRN, date of birth, ID band Patient awake    Reviewed: Allergy & Precautions, H&P , NPO status , Patient's Chart, lab work & pertinent test results  Airway Mallampati: I TM Distance: >3 FB Neck ROM: Full    Dental  (+) Teeth Intact and Dental Advisory Given   Pulmonary  breath sounds clear to auscultation        Cardiovascular hypertension, Pt. on medications Rhythm:Regular Rate:Normal     Neuro/Psych    GI/Hepatic   Endo/Other  Morbid obesity  Renal/GU      Musculoskeletal   Abdominal   Peds  Hematology   Anesthesia Other Findings   Reproductive/Obstetrics                          Anesthesia Physical Anesthesia Plan  ASA: III  Anesthesia Plan: General   Post-op Pain Management:    Induction: Intravenous  Airway Management Planned: Oral ETT  Additional Equipment:   Intra-op Plan:   Post-operative Plan: Extubation in OR  Informed Consent: I have reviewed the patients History and Physical, chart, labs and discussed the procedure including the risks, benefits and alternatives for the proposed anesthesia with the patient or authorized representative who has indicated his/her understanding and acceptance.   Dental advisory given  Plan Discussed with: CRNA, Anesthesiologist and Surgeon  Anesthesia Plan Comments:         Anesthesia Quick Evaluation

## 2013-05-25 NOTE — H&P (Signed)
Dylan Frank is an 27 y.o. male.   Chief Complaint: Severe macromastia HPI: Hx of taking respirol which caused increased growth of breast tissue to a size D cup Increased pain discomfort and embarrassment  Past Medical History  Diagnosis Date  . Depression   . ADHD (attention deficit hyperactivity disorder)   . Wears glasses   . Asthma     prn inhaler  . Autism   . History of cardiac murmur     as a child - states no problems; no murmur, per PCP note 10/22/2012  . Hypertension     under control with med., has been on med. x 3 mos.  . Gynecomastia, male 05/2013    Past Surgical History  Procedure Laterality Date  . Meatotomy      age 53  . Circumcision      age 53    Family History  Problem Relation Age of Onset  . Diabetes Mother   . Other Mother     visually impaired   Social History:  reports that he has never smoked. He has never used smokeless tobacco. He reports that he does not drink alcohol or use illicit drugs.  Allergies: No Known Allergies  Medications Prior to Admission  Medication Sig Dispense Refill  . albuterol (PROVENTIL HFA;VENTOLIN HFA) 108 (90 BASE) MCG/ACT inhaler Inhale 2 puffs into the lungs every 6 (six) hours as needed for wheezing.  1 Inhaler  12  . divalproex (DEPAKOTE ER) 500 MG 24 hr tablet Take 500 mg by mouth daily.      Marland Kitchen lisinopril (PRINIVIL,ZESTRIL) 10 MG tablet Take 1 tablet (10 mg total) by mouth daily.  90 tablet  3  . methylphenidate (CONCERTA) 18 MG CR tablet Take 18 mg by mouth every morning.      Marland Kitchen QUEtiapine (SEROQUEL) 400 MG tablet Take 800 mg by mouth at bedtime.        No results found for this or any previous visit (from the past 48 hour(s)). No results found.  Review of Systems  Constitutional: Negative.   HENT: Positive for neck pain.   Eyes: Negative.   Respiratory: Negative.   Cardiovascular: Negative.   Gastrointestinal: Negative.   Genitourinary: Negative.   Skin: Negative.   Neurological: Positive for headaches.   Endo/Heme/Allergies: Negative.   Psychiatric/Behavioral: Negative.     Blood pressure 129/84, pulse 98, temperature 98.1 F (36.7 C), temperature source Oral, resp. rate 20, height 5\' 9"  (1.753 m), weight 112.764 kg (248 lb 9.6 oz), SpO2 95.00%. Physical Exam   Assessment/Plan Increased macromastia for bilateral breast reductions  Dylan Frank L 05/25/2013, 7:27 AM

## 2013-05-25 NOTE — Anesthesia Postprocedure Evaluation (Signed)
  Anesthesia Post-op Note  Patient: Dylan Frank  Procedure(s) Performed: Procedure(s): BILATERAL BREAST REDUCTION WITH LIPO ASSISTANCE left breast (Bilateral)  Patient Location: PACU  Anesthesia Type:General  Level of Consciousness: awake, alert  and oriented  Airway and Oxygen Therapy: Patient Spontanous Breathing and Patient connected to face mask oxygen  Post-op Pain: mild  Post-op Assessment: Post-op Vital signs reviewed  Post-op Vital Signs: Reviewed  Complications: No apparent anesthesia complications

## 2013-05-25 NOTE — Anesthesia Procedure Notes (Signed)
Procedure Name: Intubation Performed by: York Grice Pre-anesthesia Checklist: Patient identified, Timeout performed, Emergency Drugs available, Suction available and Patient being monitored Patient Re-evaluated:Patient Re-evaluated prior to inductionOxygen Delivery Method: Circle system utilized Preoxygenation: Pre-oxygenation with 100% oxygen Intubation Type: IV induction Ventilation: Mask ventilation without difficulty Laryngoscope Size: Miller and 2 Grade View: Grade I Tube type: Oral Tube size: 8.0 mm Number of attempts: 1 Airway Equipment and Method: Stylet Placement Confirmation: breath sounds checked- equal and bilateral and positive ETCO2 Tube secured with: Tape Dental Injury: Teeth and Oropharynx as per pre-operative assessment

## 2013-05-26 ENCOUNTER — Encounter (HOSPITAL_BASED_OUTPATIENT_CLINIC_OR_DEPARTMENT_OTHER): Payer: Self-pay | Admitting: Specialist

## 2013-05-26 LAB — POCT HEMOGLOBIN-HEMACUE: Hemoglobin: 12.9 g/dL — ABNORMAL LOW (ref 13.0–17.0)

## 2013-05-26 NOTE — Op Note (Signed)
NAME:  CADON, RACZKA                       ACCOUNT NO.:  MEDICAL RECORD NO.:  [id number]  LOCATION:                                 FACILITY:  PHYSICIAN:  Kimara Bencomo L. Shon Hough, M.D.   DATE OF BIRTH:  DATE OF PROCEDURE: DATE OF DISCHARGE:                              OPERATIVE REPORT   This is a 27 year old male with severe macromastia secondary to taking medications, Risperdal, which has caused increased ingrowth of his breast tissue.  PROCEDURES DONE:  Bilateral breast reductions, he is over size D cup, plasty reconstruction of areas.  ANESTHESIA:  General.  The patient underwent drawings for the reduction mammoplasty inferior pedicle technique.  Then was taken to the operating room, placed on the operating room table in the supine position, was given adequate general anesthesia, intubated orally without difficulty.  Prep was done to the chest and breast areas and neck with Hibiclens soap solution, walled off with sterile towels and drapes, so as to make a sterile field.  The markings were reinforced with __________ 4% Xylocaine with epinephrine was injected locally to each side, 1:400,000 concentration, total of 200 mL per side.  This was allowed to sit up.  The wounds were scored with a #15 blade.  The skin of the inferior pedicle was de-epithelialized with #20 blade.  Medial and lateral fatty dermal pedicles were incised, new keyhole area was also excised and trimmed appropriately, removed excess skin.  Liposuction was fashioned laterally to reduce the sharp edges of the incisions and the dissections.  After proper hemostasis, irrigation was done.  The wounds were examined.  The flaps were then thinned down appropriately to match a male chest and then the flaps were transposed and stayed with 3-0 Prolene.  Subcutaneous closure was done with 3-0 Monocryl and 5-0 Monocryl throughout the inverted T.  The wounds were drained with #10 fully-fluted Blake drains, which were placed in  the depths of the wound and brought out through the lateral-most portion of the incision and secured with 3-0 Prolene.  We removed 460 g on the right side and 360 on the left to get good symmetry.  At the end of the case, the nipple-areolar complexes were examined showing excellent blood supply and suppleness.  ESTIMATED BLOOD LOSS:  Less than 150 mL.  COMPLICATIONS:  None.  The wounds were cleansed.  Steri-Strips, soft dressing were applied including Xeroform, 4x4s, ABDs, Hypafix tape.  He was taken to recovery in excellent condition.    Yaakov Guthrie. Shon Hough, M.D.    Cathie Hoops  D:  05/25/2013  T:  05/26/2013  Job:  [id number]

## 2013-06-03 ENCOUNTER — Telehealth: Payer: Self-pay | Admitting: Family Medicine

## 2013-06-03 NOTE — Telephone Encounter (Signed)
Spoke with Casimiro Needle at Dr. Charlesetta Garibaldi  office.  Medicare will not pay for gynecomastia surgery without certain documentation.  Do we have documentation of patient being autistic and having been on Risperdal in the [ast?  Will fwd to MD for advice.  I will then fax to Michael at [phone], necessary documentation.  Jacee Enerson, Darlyne Russian, CMA

## 2013-06-03 NOTE — Telephone Encounter (Signed)
Dylan Frank from Dr Leonel Ramsay office called. Pt has had surgery for gynecomastia. He needs documentation from Dr Jennette Kettle confirming autism, and the medication he had taken. Please call Casimiro Needle to get full understanding of what is needed. [phone]

## 2013-06-05 ENCOUNTER — Telehealth: Payer: Self-pay | Admitting: Family Medicine

## 2013-06-05 ENCOUNTER — Encounter: Payer: Self-pay | Admitting: Family Medicine

## 2013-06-05 NOTE — Telephone Encounter (Signed)
Printed letter, snap shot view of patient's medical record, and detail of gynecomastia in problem list with overview stating "likely secondary to long term psych meds".  Faxed to Dr. Shon Hough, Attn:Michael Woolsey, [phone].  Jacek Colson, Darlyne Russian, CMA

## 2013-06-05 NOTE — Telephone Encounter (Signed)
I have already sent this to them at least twice! I sent them  A referral letter

## 2013-06-05 NOTE — Telephone Encounter (Signed)
I am crafting a NEW referral letter and will route it to you so you can re-fax it. THANKS! Denny Levy

## 2013-10-20 ENCOUNTER — Other Ambulatory Visit: Payer: Self-pay | Admitting: Family Medicine

## 2013-10-22 ENCOUNTER — Other Ambulatory Visit: Payer: Self-pay | Admitting: *Deleted

## 2013-10-22 MED ORDER — LISINOPRIL 10 MG PO TABS: 10.0000 mg | ORAL_TABLET | Freq: Every day | ORAL | Status: DC

## 2013-11-25 ENCOUNTER — Ambulatory Visit: Payer: Medicare Other | Admitting: Family Medicine

## 2013-12-23 ENCOUNTER — Encounter: Payer: Self-pay | Admitting: Family Medicine

## 2013-12-23 ENCOUNTER — Ambulatory Visit (INDEPENDENT_AMBULATORY_CARE_PROVIDER_SITE_OTHER): Payer: Medicare Other | Admitting: Family Medicine

## 2013-12-23 VITALS — BP 136/80 | HR 97 | Temp 98.8°F | Ht 69.0 in | Wt 236.0 lb

## 2013-12-23 DIAGNOSIS — H68009 Unspecified Eustachian salpingitis, unspecified ear: Secondary | ICD-10-CM

## 2013-12-23 DIAGNOSIS — H68002 Unspecified Eustachian salpingitis, left ear: Secondary | ICD-10-CM

## 2013-12-23 DIAGNOSIS — I10 Essential (primary) hypertension: Secondary | ICD-10-CM

## 2013-12-23 DIAGNOSIS — H6590 Unspecified nonsuppurative otitis media, unspecified ear: Secondary | ICD-10-CM

## 2013-12-23 DIAGNOSIS — Z23 Encounter for immunization: Secondary | ICD-10-CM

## 2013-12-23 MED ORDER — AMOXICILLIN 500 MG PO CAPS: 500.0000 mg | ORAL_CAPSULE | Freq: Three times a day (TID) | ORAL | Status: DC

## 2013-12-23 MED ORDER — FLUTICASONE PROPIONATE 50 MCG/ACT NA SUSP
1.0000 | Freq: Every day | NASAL | Status: DC
Start: 2013-12-23 — End: 2020-04-28

## 2013-12-23 NOTE — Progress Notes (Signed)
Subjective:    Patient ID: Dylan Frank, male    DOB: [birth year], 28 y.o.   MRN: [id number]  HPI Left ear pain x4 days. Has noticed decreased hearing in that side. He also has a lot of nasal congestion. No cough.   Review of Systems No sore throat, fever, sweats, chills.    Objective:   Physical Exam Vital signs reviewed. GENERAL: Well-developed, well-nourished, no acute distress. CARDIOVASCULAR: Regular rate and rhythm no murmur gallop or rub LUNGS: Clear to auscultation bilaterally, no rales or wheeze. HEENT: Bilaterally TMs are quite retracted. The left was very dull and nonmobile. There are not good landmarks on the left .         Assessment & Plan:  #1. Eustachian tube dysfunction #2. Possible early otitis media on the left.  PLAN: I will go ahead and treat him for a trace media insertion on Flonase for eustachian tube dysfunction. It followup if not improving.

## 2013-12-23 NOTE — Patient Instructions (Signed)
Take the antibiotic one pill three times a day for 7 -10 days. If you are not having some improvement in your ear in a week or so let me know Start the fluticasone nasal spray and STAY on that for the rest of the winter months (say until May 2015). Great to see you!

## 2013-12-23 NOTE — Assessment & Plan Note (Signed)
Pretty good control. We'll continue his current medications.

## 2014-02-19 ENCOUNTER — Other Ambulatory Visit: Payer: Self-pay | Admitting: *Deleted

## 2014-02-23 MED ORDER — ALBUTEROL SULFATE HFA 108 (90 BASE) MCG/ACT IN AERS: 2.0000 | INHALATION_SPRAY | Freq: Four times a day (QID) | RESPIRATORY_TRACT | Status: DC | PRN

## 2014-03-01 ENCOUNTER — Other Ambulatory Visit: Payer: Self-pay | Admitting: *Deleted

## 2014-03-02 ENCOUNTER — Other Ambulatory Visit: Payer: Self-pay | Admitting: Family Medicine

## 2014-03-02 MED ORDER — ALBUTEROL SULFATE HFA 108 (90 BASE) MCG/ACT IN AERS: 2.0000 | INHALATION_SPRAY | Freq: Four times a day (QID) | RESPIRATORY_TRACT | Status: DC | PRN

## 2014-03-28 ENCOUNTER — Emergency Department (HOSPITAL_COMMUNITY)
Admission: EM | Admit: 2014-03-28 | Discharge: 2014-03-29 | Disposition: A | Discharge status: Home / Self Care | Payer: Medicare Other | Attending: Emergency Medicine | Admitting: Emergency Medicine

## 2014-03-28 ENCOUNTER — Encounter (HOSPITAL_COMMUNITY): Payer: Self-pay | Admitting: Emergency Medicine

## 2014-03-28 ENCOUNTER — Emergency Department (HOSPITAL_COMMUNITY): Payer: Medicare Other

## 2014-03-28 DIAGNOSIS — F840 Autistic disorder: Secondary | ICD-10-CM | POA: Insufficient documentation

## 2014-03-28 DIAGNOSIS — M545 Low back pain, unspecified: Secondary | ICD-10-CM | POA: Insufficient documentation

## 2014-03-28 DIAGNOSIS — R05 Cough: Secondary | ICD-10-CM | POA: Insufficient documentation

## 2014-03-28 DIAGNOSIS — I10 Essential (primary) hypertension: Secondary | ICD-10-CM | POA: Insufficient documentation

## 2014-03-28 DIAGNOSIS — R630 Anorexia: Secondary | ICD-10-CM | POA: Insufficient documentation

## 2014-03-28 DIAGNOSIS — R059 Cough, unspecified: Secondary | ICD-10-CM | POA: Insufficient documentation

## 2014-03-28 DIAGNOSIS — J020 Streptococcal pharyngitis: Secondary | ICD-10-CM | POA: Insufficient documentation

## 2014-03-28 DIAGNOSIS — IMO0002 Reserved for concepts with insufficient information to code with codable children: Secondary | ICD-10-CM | POA: Insufficient documentation

## 2014-03-28 DIAGNOSIS — E860 Dehydration: Secondary | ICD-10-CM | POA: Insufficient documentation

## 2014-03-28 DIAGNOSIS — F329 Major depressive disorder, single episode, unspecified: Secondary | ICD-10-CM | POA: Insufficient documentation

## 2014-03-28 DIAGNOSIS — F3289 Other specified depressive episodes: Secondary | ICD-10-CM | POA: Insufficient documentation

## 2014-03-28 DIAGNOSIS — IMO0001 Reserved for inherently not codable concepts without codable children: Secondary | ICD-10-CM | POA: Insufficient documentation

## 2014-03-28 DIAGNOSIS — F909 Attention-deficit hyperactivity disorder, unspecified type: Secondary | ICD-10-CM | POA: Insufficient documentation

## 2014-03-28 DIAGNOSIS — R51 Headache: Secondary | ICD-10-CM | POA: Insufficient documentation

## 2014-03-28 DIAGNOSIS — Z79899 Other long term (current) drug therapy: Secondary | ICD-10-CM | POA: Insufficient documentation

## 2014-03-28 DIAGNOSIS — J45909 Unspecified asthma, uncomplicated: Secondary | ICD-10-CM | POA: Insufficient documentation

## 2014-03-28 LAB — CBC WITH DIFFERENTIAL/PLATELET
BASOS PCT: 0 % (ref 0–1)
Basophils Absolute: 0 10*3/uL (ref 0.0–0.1)
EOS ABS: 0 10*3/uL (ref 0.0–0.7)
EOS PCT: 0 % (ref 0–5)
HEMATOCRIT: 44.8 % (ref 39.0–52.0)
HEMOGLOBIN: 15 g/dL (ref 13.0–17.0)
Lymphocytes Relative: 7 % — ABNORMAL LOW (ref 12–46)
Lymphs Abs: 0.5 10*3/uL — ABNORMAL LOW (ref 0.7–4.0)
MCH: 31.2 pg (ref 26.0–34.0)
MCHC: 33.5 g/dL (ref 30.0–36.0)
MCV: 93.1 fL (ref 78.0–100.0)
MONO ABS: 0.4 10*3/uL (ref 0.1–1.0)
MONOS PCT: 5 % (ref 3–12)
Neutro Abs: 6.5 10*3/uL (ref 1.7–7.7)
Neutrophils Relative %: 88 % — ABNORMAL HIGH (ref 43–77)
Platelets: 190 10*3/uL (ref 150–400)
RBC: 4.81 MIL/uL (ref 4.22–5.81)
RDW: 11.6 % (ref 11.5–15.5)
WBC: 7.4 10*3/uL (ref 4.0–10.5)

## 2014-03-28 LAB — RAPID STREP SCREEN (MED CTR MEBANE ONLY): STREPTOCOCCUS, GROUP A SCREEN (DIRECT): POSITIVE — AB

## 2014-03-28 LAB — COMPREHENSIVE METABOLIC PANEL
ALBUMIN: 3.9 g/dL (ref 3.5–5.2)
ALT: 9 U/L (ref 0–53)
AST: 22 U/L (ref 0–37)
Alkaline Phosphatase: 68 U/L (ref 39–117)
BILIRUBIN TOTAL: 0.5 mg/dL (ref 0.3–1.2)
BUN: 11 mg/dL (ref 6–23)
CO2: 26 mEq/L (ref 19–32)
CREATININE: 1.06 mg/dL (ref 0.50–1.35)
Calcium: 9.5 mg/dL (ref 8.4–10.5)
Chloride: 100 mEq/L (ref 96–112)
GFR calc non Af Amer: >90 mL/min (ref >90)
GLUCOSE: 102 mg/dL — AB (ref 70–99)
Potassium: 4.2 mEq/L (ref 3.7–5.3)
Sodium: 139 mEq/L (ref 137–147)
TOTAL PROTEIN: 7.4 g/dL (ref 6.0–8.3)

## 2014-03-28 LAB — VALPROIC ACID LEVEL: Valproic Acid Lvl: 44.8 ug/mL — ABNORMAL LOW (ref 50.0–100.0)

## 2014-03-28 LAB — I-STAT CG4 LACTIC ACID, ED: LACTIC ACID, VENOUS: 1.48 mmol/L (ref 0.5–2.2)

## 2014-03-28 MED ORDER — SODIUM CHLORIDE 0.9 % IV BOLUS (SEPSIS)
1000.0000 mL | Freq: Once | INTRAVENOUS | Status: AC
  Administered 2014-03-28: 1000 mL via INTRAVENOUS

## 2014-03-28 MED ORDER — ONDANSETRON HCL 4 MG/2ML IJ SOLN
4.0000 mg | Freq: Once | INTRAMUSCULAR | Status: AC
  Administered 2014-03-28: 4 mg via INTRAVENOUS
  Filled 2014-03-28: qty 2

## 2014-03-28 MED ORDER — KETOROLAC TROMETHAMINE 30 MG/ML IJ SOLN
30.0000 mg | Freq: Once | INTRAMUSCULAR | Status: AC
  Administered 2014-03-28: 30 mg via INTRAVENOUS
  Filled 2014-03-28: qty 1

## 2014-03-28 MED ORDER — PENICILLIN G BENZATHINE 1200000 UNIT/2ML IM SUSP
1.2000 10*6.[IU] | Freq: Once | INTRAMUSCULAR | Status: AC
  Administered 2014-03-28: 1.2 10*6.[IU] via INTRAMUSCULAR
  Filled 2014-03-28: qty 2

## 2014-03-28 MED ORDER — SODIUM CHLORIDE 0.9 % IV BOLUS (SEPSIS)
1000.0000 mL | Freq: Once | INTRAVENOUS | Status: AC
  Administered 2014-03-29: 1000 mL via INTRAVENOUS

## 2014-03-28 MED ORDER — MORPHINE SULFATE 4 MG/ML IJ SOLN
4.0000 mg | Freq: Once | INTRAMUSCULAR | Status: AC
  Administered 2014-03-29: 4 mg via INTRAVENOUS
  Filled 2014-03-28: qty 1

## 2014-03-28 NOTE — ED Notes (Addendum)
Onset 12pm today pts legs starting hurting, radiating up to back, then to abd.  Now c/o nausea, headache and dizziness.  No other s/s noted.  Assist x 2 from wheelchair to bed.

## 2014-03-28 NOTE — ED Notes (Signed)
Patient transported to X-ray 

## 2014-03-28 NOTE — ED Notes (Signed)
Pt c/o nausea, abdominal pain, lower back pain, and has not been able to void since 10 am. Denies vomiting. Fever.

## 2014-03-28 NOTE — ED Notes (Signed)
Pt also states he hasn't urinated since 12pm today. Pt has not drank much fluids today.

## 2014-03-28 NOTE — ED Provider Notes (Signed)
Her CSN: [id number]     Arrival date & time 03/28/14  1921 History   First MD Initiated Contact with Patient 03/28/14 2207     Chief Complaint  Patient presents with  . Abdominal Pain     (Consider location/radiation/quality/duration/timing/severity/associated sxs/prior Treatment) HPI Comments: Patient presents with nausea, body aches, abdominal pain, back pain, leg pain onset today around 12 PM. He has felt warm today but, not checked his temperature. He is febrile on arrival. he's had nausea and decreased by mouth intake throughout the day today. He says he has not urinated since yesterday. Denies any sick contacts or recent travel. He has poor appetite. He denies any chest pain or shortness of breath. He has diffuse abdominal soreness, headache, leg pain and body aches. Denies any testicular pain, dysuria hematuria.  The history is provided by the patient and a relative.    Past Medical History  Diagnosis Date  . Depression   . ADHD (attention deficit hyperactivity disorder)   . Wears glasses   . Asthma     prn inhaler  . Autism   . History of cardiac murmur     as a child - states no problems; no murmur, per PCP note 10/22/2012  . Hypertension     under control with med., has been on med. x 3 mos.  . Gynecomastia, male 05/2013   Past Surgical History  Procedure Laterality Date  . Meatotomy      age 89  . Circumcision      age 89  . Breast reduction surgery Bilateral 05/25/2013    Procedure: BILATERAL BREAST REDUCTION WITH LIPO ASSISTANCE left breast;  Surgeon: Louisa Second, MD;  Location: [facility];  Service: Plastics;  Laterality: Bilateral;   Family History  Problem Relation Age of Onset  . Diabetes Mother   . Other Mother     visually impaired   History  Substance Use Topics  . Smoking status: Never Smoker   . Smokeless tobacco: Never Used  . Alcohol Use: No    Review of Systems  Constitutional: Positive for fever, activity change, appetite  change and fatigue.  HENT: Negative for congestion, rhinorrhea and sore throat.   Respiratory: Positive for cough. Negative for chest tightness.   Cardiovascular: Negative for chest pain.  Gastrointestinal: Positive for nausea and abdominal pain. Negative for vomiting and diarrhea.  Genitourinary: Positive for decreased urine volume and difficulty urinating. Negative for dysuria and testicular pain.  Musculoskeletal: Positive for arthralgias and myalgias. Negative for neck pain and neck stiffness.  Skin: Negative for wound.  Neurological: Positive for weakness and light-headedness.  A complete 10 system review of systems was obtained and all systems are negative except as noted in the HPI and PMH.      Allergies  Review of patient's allergies indicates no known allergies.  Home Medications   Current Outpatient Rx  Name  Route  Sig  Dispense  Refill  . albuterol (PROVENTIL HFA;VENTOLIN HFA) 108 (90 BASE) MCG/ACT inhaler   Inhalation   Inhale 2 puffs into the lungs every 6 (six) hours as needed for wheezing.   1 Inhaler   12   . divalproex (DEPAKOTE ER) 500 MG 24 hr tablet   Oral   Take 500 mg by mouth every evening.          . fluticasone (FLONASE) 50 MCG/ACT nasal spray   Each Nare   Place 1 spray into both nostrils daily.   16 g   12   .  lisinopril (PRINIVIL,ZESTRIL) 10 MG tablet   Oral   Take 1 tablet (10 mg total) by mouth daily.   90 tablet   3   . methylphenidate (CONCERTA) 18 MG CR tablet   Oral   Take 18 mg by mouth every morning.         Marland Kitchen. QUEtiapine (SEROQUEL) 400 MG tablet   Oral   Take 400 mg by mouth at bedtime.           BP 110/77  Pulse 105  Temp(Src) 99.9 F (37.7 C) (Oral)  Resp 18  Ht 5\' 9"  (1.753 m)  Wt 226 lb (102.513 kg)  BMI 33.36 kg/m2  SpO2 98% Physical Exam  Constitutional: He is oriented to person, place, and time. He appears well-developed and well-nourished. No distress.  HENT:  Head: Normocephalic and atraumatic.   Mouth/Throat: Oropharynx is clear and moist. No oropharyngeal exudate.  Mild OP erythema  Eyes: EOM are normal. Pupils are equal, round, and reactive to light.  Neck: Normal range of motion. Neck supple.  No meningismus  Cardiovascular: Normal rate, regular rhythm and normal heart sounds.   Pulmonary/Chest: Effort normal and breath sounds normal. No respiratory distress.  Abdominal: Soft. There is no tenderness. There is no rebound and no guarding.  Mild diffuse tenderness, no guarding or rebound No pain at McBurney's point  Genitourinary:  No testicular tenderness  Musculoskeletal: Normal range of motion. He exhibits no edema and no tenderness.  Neurological: He is alert and oriented to person, place, and time. No cranial nerve deficit. He exhibits normal muscle tone. Coordination normal.  Skin: Skin is warm.    ED Course  Procedures (including critical care time) Labs Review Labs Reviewed  RAPID STREP SCREEN - Abnormal; Notable for the following:    Streptococcus, Group A Screen (Direct) POSITIVE (*)    All other components within normal limits  CBC WITH DIFFERENTIAL - Abnormal; Notable for the following:    Neutrophils Relative % 88 (*)    Lymphocytes Relative 7 (*)    Lymphs Abs 0.5 (*)    All other components within normal limits  COMPREHENSIVE METABOLIC PANEL - Abnormal; Notable for the following:    Glucose, Bld 102 (*)    All other components within normal limits  URINALYSIS, ROUTINE W REFLEX MICROSCOPIC - Abnormal; Notable for the following:    Ketones, ur 15 (*)    All other components within normal limits  VALPROIC ACID LEVEL - Abnormal; Notable for the following:    Valproic Acid Lvl 44.8 (*)    All other components within normal limits  I-STAT CG4 LACTIC ACID, ED   Imaging Review Dg Chest 2 View  03/28/2014   CLINICAL DATA:  Fever, back pain  EXAM: CHEST  2 VIEW  COMPARISON:  DG CHEST 2 VIEW dated 09/19/2006  FINDINGS: The heart size and mediastinal contours  are within normal limits. Both lungs are clear. The visualized skeletal structures are unremarkable.  IMPRESSION: No active cardiopulmonary disease.   Electronically Signed   By: Elige KoHetal  Patel   On: 03/28/2014 23:34     EKG Interpretation None      MDM   Final diagnoses:  Strep pharyngitis  Dehydration   Patient with 12 hour history of body aches, nausea, abdominal pain, low back pain and headache. Fever to 100.9 on arrival. No meningismus. Abdomen is soft without peritoneal signs. No right lower quadrant tenderness.  Bladder scan with 150 cc.   Chest x-ray is negative. Urinalysis shows ketones.  Workup remarkable for positive strep test. Urinalysis showed ketones only. Labs unremarkable. Fever resolved with treatment here in ED. Patient given IV fluids and is tolerating by mouth fluids in the ED. HR improved to 93.  Tolerating PO.  Return precautions discussed.      Glynn Octave, MD 03/29/14 [phone]

## 2014-03-29 LAB — URINALYSIS, ROUTINE W REFLEX MICROSCOPIC
Bilirubin Urine: NEGATIVE
GLUCOSE, UA: NEGATIVE mg/dL
Hgb urine dipstick: NEGATIVE
Ketones, ur: 15 mg/dL — AB
LEUKOCYTES UA: NEGATIVE
NITRITE: NEGATIVE
PH: 7.5 (ref 5.0–8.0)
PROTEIN: NEGATIVE mg/dL
Specific Gravity, Urine: 1.03 (ref 1.005–1.030)
Urobilinogen, UA: 1 mg/dL (ref 0.0–1.0)

## 2014-03-29 MED ORDER — SODIUM CHLORIDE 0.9 % IV BOLUS (SEPSIS): 1000.0000 mL | Freq: Once | INTRAVENOUS | Status: DC

## 2014-03-29 NOTE — Discharge Instructions (Signed)
Strep Throat Keep yourself hydrated. Return to the ED if you develop worsening pain, fever, or any other concerns. Strep throat is an infection of the throat caused by a bacteria named Streptococcus pyogenes. Your caregiver may call the infection streptococcal "tonsillitis" or "pharyngitis" depending on whether there are signs of inflammation in the tonsils or back of the throat. Strep throat is most common in children aged 28 15 years during the cold months of the year, but it can occur in people of any age during any season. This infection is spread from person to person (contagious) through coughing, sneezing, or other close contact. SYMPTOMS   Fever or chills.  Painful, swollen, red tonsils or throat.  Pain or difficulty when swallowing.  White or yellow spots on the tonsils or throat.  Swollen, tender lymph nodes or "glands" of the neck or under the jaw.  Red rash all over the body (rare). DIAGNOSIS  Many different infections can cause the same symptoms. A test must be done to confirm the diagnosis so the right treatment can be given. A "rapid strep test" can help your caregiver make the diagnosis in a few minutes. If this test is not available, a light swab of the infected area can be used for a throat culture test. If a throat culture test is done, results are usually available in a day or two. TREATMENT  Strep throat is treated with antibiotic medicine. HOME CARE INSTRUCTIONS   Gargle with 1 tsp of salt in 1 cup of warm water, 3 4 times per day or as needed for comfort.  Family members who also have a sore throat or fever should be tested for strep throat and treated with antibiotics if they have the strep infection.  Make sure everyone in your household washes their hands well.  Do not share food, drinking cups, or personal items that could cause the infection to spread to others.  You may need to eat a soft food diet until your sore throat gets better.  Drink enough water and  fluids to keep your urine clear or pale yellow. This will help prevent dehydration.  Get plenty of rest.  Stay home from school, daycare, or work until you have been on antibiotics for 24 hours.  Only take over-the-counter or prescription medicines for pain, discomfort, or fever as directed by your caregiver.  If antibiotics are prescribed, take them as directed. Finish them even if you start to feel better. SEEK MEDICAL CARE IF:   The glands in your neck continue to enlarge.  You develop a rash, cough, or earache.  You cough up green, yellow-brown, or bloody sputum.  You have pain or discomfort not controlled by medicines.  Your problems seem to be getting worse rather than better. SEEK IMMEDIATE MEDICAL CARE IF:   You develop any new symptoms such as vomiting, severe headache, stiff or painful neck, chest pain, shortness of breath, or trouble swallowing.  You develop severe throat pain, drooling, or changes in your voice.  You develop swelling of the neck, or the skin on the neck becomes red and tender.  You have a fever.  You develop signs of dehydration, such as fatigue, dry mouth, and decreased urination.  You become increasingly sleepy, or you cannot wake up completely. Document Released: 11/30/2000 Document Revised: 11/19/2012 Document Reviewed: 02/01/2011 [facility] Patient Information 2014 [place], [place].  Dehydration, Adult Dehydration is when you lose more fluids from the body than you take in. Vital organs like the kidneys, brain, and  heart cannot function without a proper amount of fluids and salt. Any loss of fluids from the body can cause dehydration.  CAUSES   Vomiting.  Diarrhea.  Excessive sweating.  Excessive urine output.  Fever. SYMPTOMS  Mild dehydration  Thirst.  Dry lips.  Slightly dry mouth. Moderate dehydration  Very dry mouth.  Sunken eyes.  Skin does not bounce back quickly when lightly pinched and released.  Dark urine and  decreased urine production.  Decreased tear production.  Headache. Severe dehydration  Very dry mouth.  Extreme thirst.  Rapid, weak pulse (more than 100 beats per minute at rest).  Cold hands and feet.  Not able to sweat in spite of heat and temperature.  Rapid breathing.  Blue lips.  Confusion and lethargy.  Difficulty being awakened.  Minimal urine production.  No tears. DIAGNOSIS  Your caregiver will diagnose dehydration based on your symptoms and your exam. Blood and urine tests will help confirm the diagnosis. The diagnostic evaluation should also identify the cause of dehydration. TREATMENT  Treatment of mild or moderate dehydration can often be done at home by increasing the amount of fluids that you drink. It is best to drink small amounts of fluid more often. Drinking too much at one time can make vomiting worse. Refer to the home care instructions below. Severe dehydration needs to be treated at the hospital where you will probably be given intravenous (IV) fluids that contain water and electrolytes. HOME CARE INSTRUCTIONS   Ask your caregiver about specific rehydration instructions.  Drink enough fluids to keep your urine clear or pale yellow.  Drink small amounts frequently if you have nausea and vomiting.  Eat as you normally do.  Avoid:  Foods or drinks high in sugar.  Carbonated drinks.  Juice.  Extremely hot or cold fluids.  Drinks with caffeine.  Fatty, greasy foods.  Alcohol.  Tobacco.  Overeating.  Gelatin desserts.  Wash your hands well to avoid spreading bacteria and viruses.  Only take over-the-counter or prescription medicines for pain, discomfort, or fever as directed by your caregiver.  Ask your caregiver if you should continue all prescribed and over-the-counter medicines.  Keep all follow-up appointments with your caregiver. SEEK MEDICAL CARE IF:  You have abdominal pain and it increases or stays in one area  (localizes).  You have a rash, stiff neck, or severe headache.  You are irritable, sleepy, or difficult to awaken.  You are weak, dizzy, or extremely thirsty. SEEK IMMEDIATE MEDICAL CARE IF:   You are unable to keep fluids down or you get worse despite treatment.  You have frequent episodes of vomiting or diarrhea.  You have blood or green matter (bile) in your vomit.  You have blood in your stool or your stool looks black and tarry.  You have not urinated in 6 to 8 hours, or you have only urinated a small amount of very dark urine.  You have a fever.  You faint. MAKE SURE YOU:   Understand these instructions.  Will watch your condition.  Will get help right away if you are not doing well or get worse. Document Released: 12/03/2005 Document Revised: 02/25/2012 Document Reviewed: 07/23/2011 [facility] Patient Information 2014 [place]ExitCare, MarylandLLC.

## 2014-03-29 NOTE — ED Notes (Signed)
Per verbal from Rancour MD, discharge pt after 2nd bag of fluids

## 2014-03-30 ENCOUNTER — Telehealth: Payer: Self-pay | Admitting: Family Medicine

## 2014-03-30 NOTE — Telephone Encounter (Signed)
Spoke with patient and he stated that he was given a shot at the ED. He is still in pain and running to the bathroom constantly. If possible he would like something else called in please.

## 2014-03-30 NOTE — Telephone Encounter (Signed)
Dear Cliffton AstersWhite Team I see where he was diagnosed with Strep throat in ED---I cannot tell for sure whether or not they gave him antibiotic. Can you call and ask him if they gave him either : a BOG shout of antibiotic in ED---he would remember it because it would have been rocephin or PCN and very painful OR if they gave him a rx. If NOT, then I need to call something in for him [facility]AN[facility]! ,me

## 2014-03-31 ENCOUNTER — Ambulatory Visit: Payer: Medicare Other | Admitting: Family Medicine

## 2014-04-01 ENCOUNTER — Ambulatory Visit (INDEPENDENT_AMBULATORY_CARE_PROVIDER_SITE_OTHER): Payer: Medicare Other | Admitting: Family Medicine

## 2014-04-01 ENCOUNTER — Encounter: Payer: Self-pay | Admitting: Family Medicine

## 2014-04-01 VITALS — BP 110/66 | HR 109 | Temp 98.9°F | Ht 69.0 in | Wt 229.0 lb

## 2014-04-01 DIAGNOSIS — F912 Conduct disorder, adolescent-onset type: Secondary | ICD-10-CM

## 2014-04-01 DIAGNOSIS — B9789 Other viral agents as the cause of diseases classified elsewhere: Secondary | ICD-10-CM

## 2014-04-01 DIAGNOSIS — B349 Viral infection, unspecified: Secondary | ICD-10-CM

## 2014-04-01 MED ORDER — CEPHALEXIN 500 MG PO CAPS: 500.0000 mg | ORAL_CAPSULE | Freq: Three times a day (TID) | ORAL | Status: DC

## 2014-04-01 MED ORDER — IBUPROFEN 800 MG PO TABS: ORAL_TABLET | ORAL | Status: DC

## 2014-04-01 NOTE — Telephone Encounter (Signed)
Dear White Team [name] Please let him know I have called in an antibiotic and some hi dose ibuprofen (for pain). The message said he was 'running to the bathroom". Is that diarrhea? Or urinary frequency? If that does not get better in next day he needs to let me know. If diarrhea he can take on or two doses of OTC lomotil , If urinary frequency, the antibiotic should make it better. If he gets worse in any way we need to see him OR if he is not improving next day or so. THANKS! Nestor RampSara L Neal

## 2014-04-01 NOTE — Telephone Encounter (Signed)
Called pt. Spoke with pt's mom Claris CheMargaret and she reports, that the pt has diarrhea. She agreed with Dr.Neal's plan and says thank you so much. Fwd to Dr.Neal for review. Arlyss Repress.Marigny Borre

## 2014-04-02 DIAGNOSIS — B349 Viral infection, unspecified: Secondary | ICD-10-CM | POA: Insufficient documentation

## 2014-04-02 NOTE — Assessment & Plan Note (Signed)
Symptoms appear to be consistent with viral/influenza like illness. Now currently resolved. Patient clear to return back to work.

## 2014-04-02 NOTE — Progress Notes (Signed)
Subjective:    Patient ID: Dylan Frank, male    DOB: 03/11/1986, 28 y.o.   MRN: [id number]  HPI 28 year old male with HTN, ADHD, and Conduct disorder presents for ED follow up.   Patient was recently seen in the ED on 4/12 with fever, nausea, body aches, abdominal pain, back pain, and leg pain.  Workup was unremarkable other than ketones in the urine and a + rapid strep.  He was treated with IV fluids and was discharged home.  He reports that he is now feeling well.  He has no complaints today and states that all of his symptoms have resolved.  He is eager to return to work and would like note to return.   Review of Systems Per HPI    Objective:   Physical Exam Filed Vitals:   04/01/14 1445  BP: 110/66  Pulse: 109  Temp: 98.9 F (37.2 C)   Exam: General: well appearing, NAD.  HEENT: NCAT.  Normal TM's bilaterally.  Oropharynx clear.  Cardiovascular: RRR. No murmurs, rubs, or gallops. Respiratory: CTAB. No rales, rhonchi, or wheeze. Psych: flat affect noted.    Assessment & Plan:  See problem list

## 2014-06-09 ENCOUNTER — Ambulatory Visit (INDEPENDENT_AMBULATORY_CARE_PROVIDER_SITE_OTHER): Payer: Medicare Other | Admitting: Family Medicine

## 2014-06-09 ENCOUNTER — Encounter: Payer: Self-pay | Admitting: Family Medicine

## 2014-06-09 VITALS — BP 123/78 | HR 88 | Temp 98.4°F | Wt 222.0 lb

## 2014-06-09 DIAGNOSIS — N62 Hypertrophy of breast: Secondary | ICD-10-CM | POA: Diagnosis not present

## 2014-06-09 DIAGNOSIS — F912 Conduct disorder, adolescent-onset type: Secondary | ICD-10-CM | POA: Diagnosis not present

## 2014-06-09 DIAGNOSIS — J45909 Unspecified asthma, uncomplicated: Secondary | ICD-10-CM | POA: Diagnosis not present

## 2014-06-09 DIAGNOSIS — I10 Essential (primary) hypertension: Secondary | ICD-10-CM

## 2014-06-09 NOTE — Assessment & Plan Note (Signed)
No current problems with his asthma. He does have an albuterol inhaler at home eerie he's been staying inside mostly during the hot weather and that seems to keep him from having problems.

## 2014-06-09 NOTE — Progress Notes (Signed)
Subjective:    Patient ID: Dylan Frank, male    DOB: Apr 24, 1986, 28 y.o.   MRN: [id number]  HPI  Followup hypertension. Taking his medicines regularly without problem. No side effects. He's been little more exercise and is managed to lose some weight.  Continues to followed by mental health system for his conduct disorder attention deficit disorder. I having problems in that respect. He is out of school/work for the summer.  Gynecomastia: He had surgery and is quite pleased with the results. No postsurgical problem Asthma: No problems recently  Review of Systems A chest pain, no shortness of breath with exertion. No fever, sweats, chills, unusual weight change.    Objective:   Physical Exam Vital signs reviewed. GENERAL: Well-developed, well-nourished, no acute distress. CARDIOVASCULAR: Regular rate and rhythm no murmur gallop or rub LUNGS: Clear to auscultation bilaterally, no rales or wheeze. ABDOMEN: Soft positive bowel sounds NEURO: No gross focal neurological deficits. MSK: Movement of extremity x 4.         Assessment & Plan:

## 2014-06-09 NOTE — Assessment & Plan Note (Signed)
Seems to be fairly stable right now. He is being followed by psychiatry and they've made no significant changes in his medications. He's not had any recent weight gain on his meds and in fact she's had some welcome weight loss which he is congratulated

## 2014-06-09 NOTE — Assessment & Plan Note (Signed)
Doing well on his current regimen so we'll make no medication changes. He's congratulated on his weight loss. I'll see him back in 6 months the

## 2014-11-19 ENCOUNTER — Other Ambulatory Visit: Payer: Medicare Other

## 2014-12-20 ENCOUNTER — Other Ambulatory Visit: Payer: Self-pay | Admitting: *Deleted

## 2014-12-20 MED ORDER — LISINOPRIL 10 MG PO TABS: 10.0000 mg | ORAL_TABLET | Freq: Every day | ORAL | Status: DC

## 2015-03-10 IMAGING — CR DG CHEST 2V
2 series · 2 of 2 positions shown · non-contrast
Comparison: DG CHEST 2 VIEW dated 09/19/2006

CLINICAL DATA: Fever, back pain

EXAM:
CHEST  2 VIEW

[x chest ap]
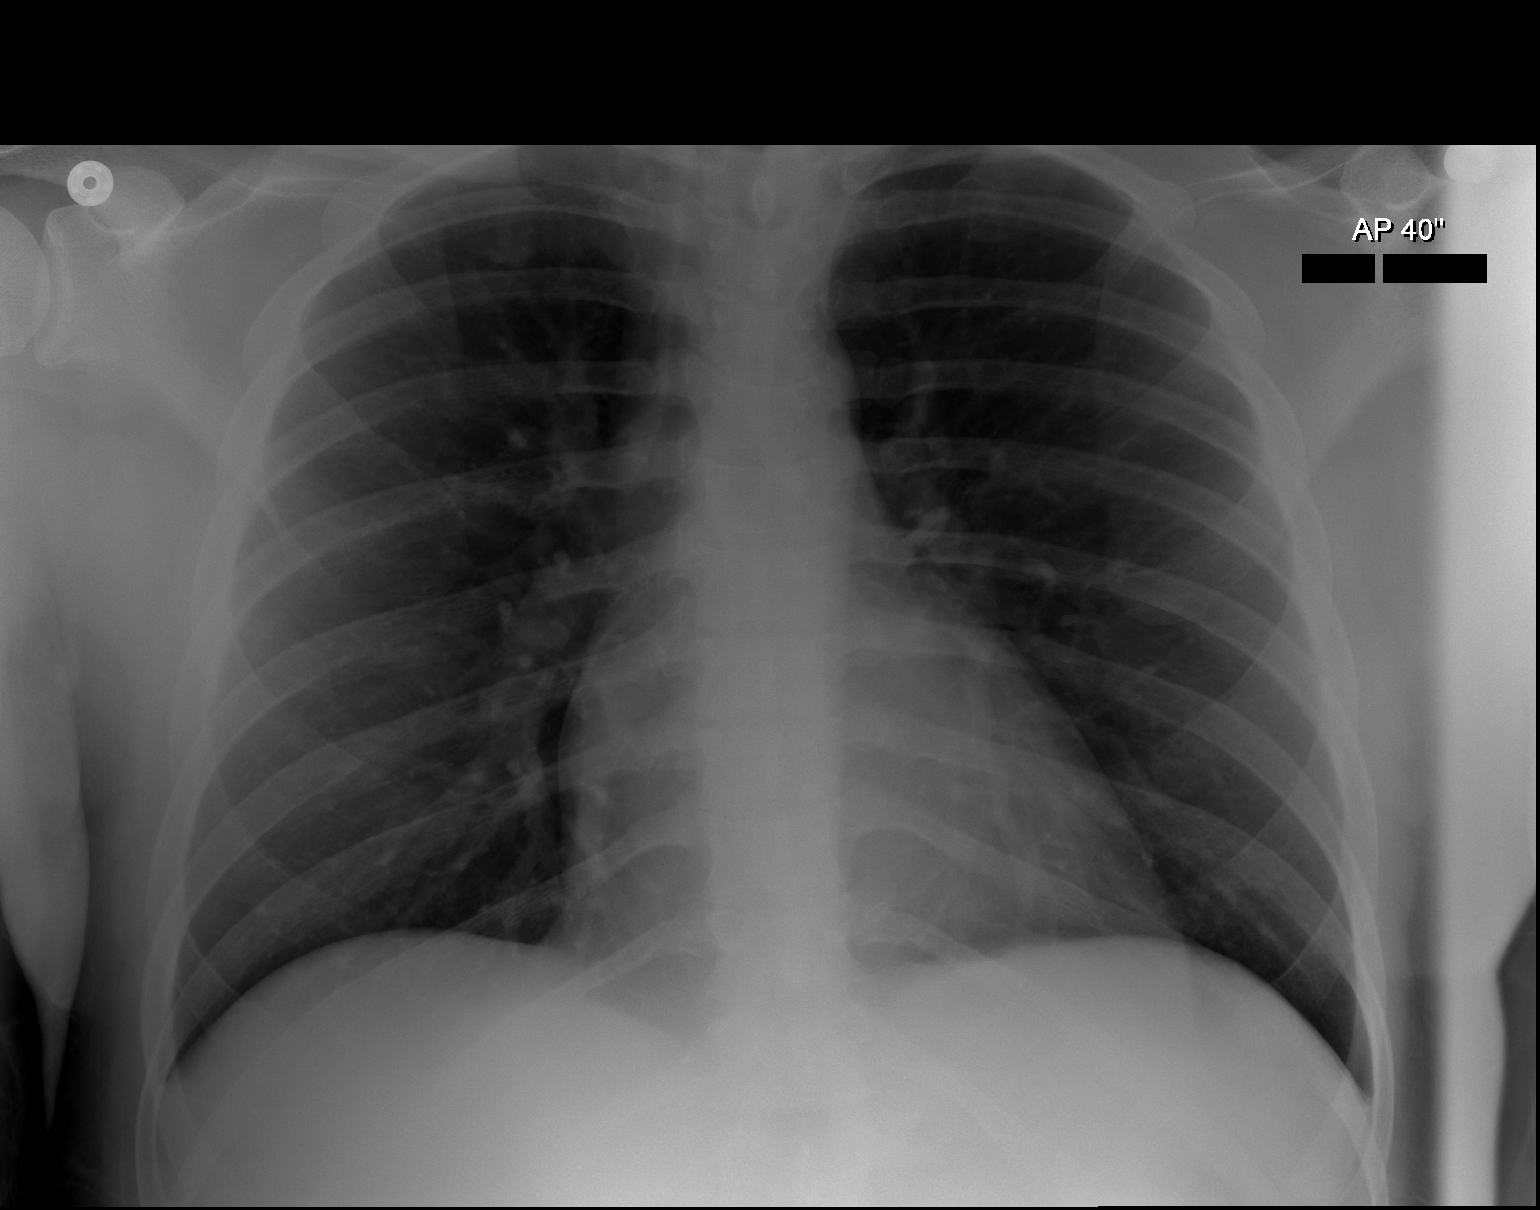

[w chest lat]
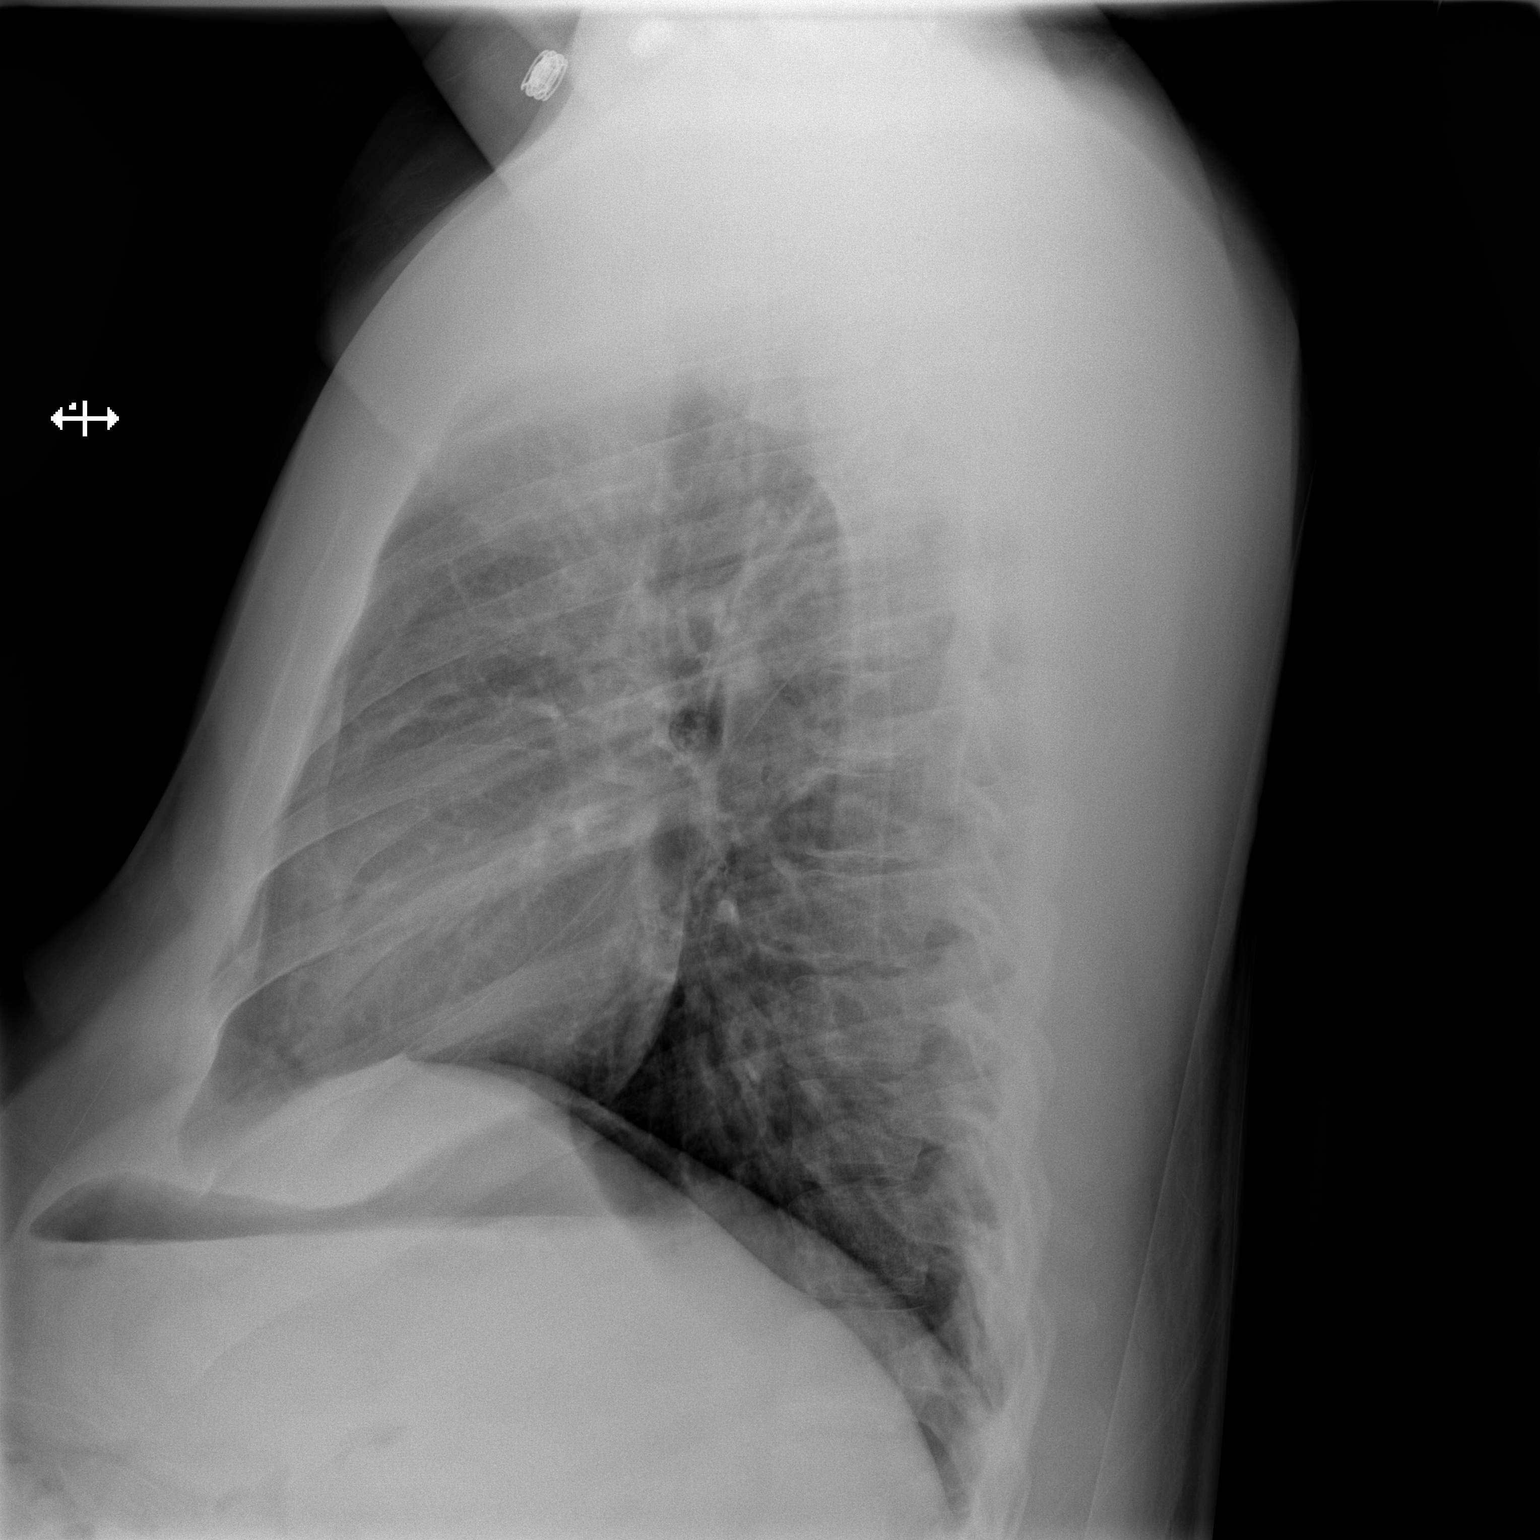

[2 of 2 positions shown; findings below may reference images not displayed]

FINDINGS: The heart size and mediastinal contours are within normal limits.
Both lungs are clear. The visualized skeletal structures are
unremarkable.
IMPRESSION: No active cardiopulmonary disease.

## 2015-04-01 ENCOUNTER — Telehealth: Payer: Self-pay | Admitting: *Deleted

## 2015-04-01 MED ORDER — LISINOPRIL 10 MG PO TABS: 10.0000 mg | ORAL_TABLET | Freq: Every day | ORAL | Status: DC

## 2015-09-02 ENCOUNTER — Emergency Department (HOSPITAL_COMMUNITY)
Admission: EM | Admit: 2015-09-02 | Discharge: 2015-09-02 | Disposition: A | Discharge status: Home / Self Care | Payer: Medicare Other | Attending: Emergency Medicine | Admitting: Emergency Medicine

## 2015-09-02 ENCOUNTER — Encounter (HOSPITAL_COMMUNITY): Payer: Self-pay | Admitting: *Deleted

## 2015-09-02 DIAGNOSIS — F840 Autistic disorder: Secondary | ICD-10-CM | POA: Insufficient documentation

## 2015-09-02 DIAGNOSIS — Z792 Long term (current) use of antibiotics: Secondary | ICD-10-CM | POA: Insufficient documentation

## 2015-09-02 DIAGNOSIS — R011 Cardiac murmur, unspecified: Secondary | ICD-10-CM | POA: Diagnosis not present

## 2015-09-02 DIAGNOSIS — Z79899 Other long term (current) drug therapy: Secondary | ICD-10-CM | POA: Insufficient documentation

## 2015-09-02 DIAGNOSIS — J029 Acute pharyngitis, unspecified: Secondary | ICD-10-CM | POA: Diagnosis present

## 2015-09-02 DIAGNOSIS — I10 Essential (primary) hypertension: Secondary | ICD-10-CM | POA: Insufficient documentation

## 2015-09-02 DIAGNOSIS — Z7951 Long term (current) use of inhaled steroids: Secondary | ICD-10-CM | POA: Insufficient documentation

## 2015-09-02 DIAGNOSIS — J45909 Unspecified asthma, uncomplicated: Secondary | ICD-10-CM | POA: Insufficient documentation

## 2015-09-02 DIAGNOSIS — R000 Tachycardia, unspecified: Secondary | ICD-10-CM | POA: Diagnosis not present

## 2015-09-02 DIAGNOSIS — F329 Major depressive disorder, single episode, unspecified: Secondary | ICD-10-CM | POA: Diagnosis not present

## 2015-09-02 LAB — RAPID STREP SCREEN (MED CTR MEBANE ONLY): Streptococcus, Group A Screen (Direct): NEGATIVE

## 2015-09-02 MED ORDER — IBUPROFEN 800 MG PO TABS: 800.0000 mg | ORAL_TABLET | Freq: Three times a day (TID) | ORAL | Status: DC | PRN

## 2015-09-02 MED ORDER — HYDROCODONE-ACETAMINOPHEN 7.5-325 MG/15ML PO SOLN: 10.0000 mL | Freq: Four times a day (QID) | ORAL | Status: DC | PRN

## 2015-09-02 MED ORDER — HYDROCODONE-ACETAMINOPHEN 7.5-325 MG/15ML PO SOLN
10.0000 mL | Freq: Once | ORAL | Status: AC
  Administered 2015-09-02: 10 mL via ORAL
  Filled 2015-09-02: qty 15

## 2015-09-02 NOTE — ED Provider Notes (Signed)
CSN: [id number]     Arrival date & time 09/02/15  1449 History  This chart was scribed for non-physician practitioner, Trixie Dredge, PA-C working with Margarita Grizzle, MD, by Jarvis Morgan, ED Scribe. This patient was seen in room TR07C/TR07C and the patient's care was started at 3:53 PM.     Chief Complaint  Patient presents with  . Sore Throat    The history is provided by the patient. No language interpreter was used.    HPI Comments: Dylan Frank is a 29 y.o. male with a h/o asthma who presents to the Emergency Department complaining of constant, moderate, sore throat onset 2 days. He states he has taken Advil with mild relief. Last dose was 2 tablets this morning. He endorses the pain is exacerbated with swallowing and talking. He is able to drink and without difficulty. He denies any trauma to the neck. Pt denies any sick contacts. He denies any fever, chills, nasal congestion, rhinorrhea, cough, SOB or trouble swallowing. NKDA  Past Medical History  Diagnosis Date  . Depression   . ADHD (attention deficit hyperactivity disorder)   . Wears glasses   . Asthma     prn inhaler  . Autism   . History of cardiac murmur     as a child - states no problems; no murmur, per PCP note 10/22/2012  . Hypertension     under control with med., has been on med. x 3 mos.  . Gynecomastia, male 05/2013   Past Surgical History  Procedure Laterality Date  . Meatotomy      age 60  . Circumcision      age 60  . Breast reduction surgery Bilateral 05/25/2013    Procedure: BILATERAL BREAST REDUCTION WITH LIPO ASSISTANCE left breast;  Surgeon: Louisa Second, MD;  Location: [facility];  Service: Plastics;  Laterality: Bilateral;   Family History  Problem Relation Age of Onset  . Diabetes Mother   . Other Mother     visually impaired   Social History  Substance Use Topics  . Smoking status: Never Smoker   . Smokeless tobacco: Never Used  . Alcohol Use: Yes    Review of Systems   Constitutional: Negative for fever and chills.  HENT: Positive for sore throat. Negative for congestion, ear pain, rhinorrhea, sinus pressure and trouble swallowing.   Respiratory: Negative for cough and shortness of breath.   Musculoskeletal: Negative for neck stiffness.  Skin: Negative for color change.  Allergic/Immunologic: Negative for immunocompromised state.  Hematological: Does not bruise/bleed easily.  Psychiatric/Behavioral: Negative for self-injury.      Allergies  Review of patient's allergies indicates no known allergies.  Home Medications   Prior to Admission medications   Medication Sig Start Date End Date Taking? Authorizing Provider  albuterol (PROVENTIL HFA;VENTOLIN HFA) 108 (90 BASE) MCG/ACT inhaler Inhale 2 puffs into the lungs every 6 (six) hours as needed for wheezing. 03/02/14   Nestor Ramp, MD  cephALEXin (KEFLEX) 500 MG capsule Take 1 capsule (500 mg total) by mouth 3 (three) times daily. 04/01/14   Nestor Ramp, MD  divalproex (DEPAKOTE ER) 500 MG 24 hr tablet Take 500 mg by mouth every evening.     Historical Provider, MD  fluticasone (FLONASE) 50 MCG/ACT nasal spray Place 1 spray into both nostrils daily. 12/23/13   Nestor Ramp, MD  ibuprofen (ADVIL,MOTRIN) 800 MG tablet Take one by mouth every 8 hours for throat pain as needed 04/01/14   Roxine Caddy  Jennette Kettle, MD  lisinopril (PRINIVIL,ZESTRIL) 10 MG tablet Take 1 tablet (10 mg total) by mouth daily. 04/01/15   Nestor Ramp, MD  methylphenidate (CONCERTA) 18 MG CR tablet Take 18 mg by mouth every morning.    Historical Provider, MD  QUEtiapine (SEROQUEL) 400 MG tablet Take 400 mg by mouth at bedtime.     Historical Provider, MD   Triage Vitals: BP 136/70 mmHg  Pulse 117  Temp(Src) 99.2 F (37.3 C) (Oral)  Resp 18  SpO2 97%  Physical Exam  Constitutional: He appears well-developed and well-nourished. No distress.  HENT:  Head: Normocephalic and atraumatic.  Mouth/Throat: Uvula is midline and mucous membranes are  normal. Posterior oropharyngeal edema and posterior oropharyngeal erythema present. No oropharyngeal exudate.  Eyes: Conjunctivae and EOM are normal. Right eye exhibits no discharge. Left eye exhibits no discharge.  Neck: Normal range of motion. Neck supple.  Cardiovascular: Regular rhythm and normal heart sounds.  Tachycardia present.   Pulmonary/Chest: Effort normal and breath sounds normal. No stridor. No respiratory distress. He has no wheezes. He has no rales.  Lymphadenopathy:    He has cervical adenopathy (anterior).  Neurological: He is alert. He exhibits normal muscle tone.  Skin: No rash noted. He is not diaphoretic.  Nursing note and vitals reviewed.   ED Course  Procedures (including critical care time)  DIAGNOSTIC STUDIES: Oxygen Saturation is 97% on RA, normal by my interpretation.    COORDINATION OF CARE:  3:54 PM- Will order rapid strep screen and culture.  Pt advised of plan for treatment and pt agrees.    Labs Review Labs Reviewed  RAPID STREP SCREEN (NOT AT [facility])    Imaging Review No results found. I have personally reviewed and evaluated these images and lab results as part of my medical decision-making.   EKG Interpretation None      MDM   Final diagnoses:  Pharyngitis    Afebrile, nontoxic patient with sore throat.   Tachycardia likely due to pain, possible mild dehydration.  Given lots of cold water to drink. Tolerating PO.  Strep screen pending at change of shift.  Discussed with Fayrene Helper, PA-C, who assumes care of the patient.  No airway concerns.  Doubt peritonsillar abscess.   Anticipate D/C home with pain medication, PCP follow up, return precautions.  I personally performed the services described in this documentation, which was scribed in my presence. The recorded information has been reviewed and is accurate.    Trixie Dredge, PA-C 09/02/15 1633  Margarita Grizzle, MD 09/13/15 [phone]

## 2015-09-02 NOTE — Discharge Instructions (Signed)
Read the information below.  Use the prescribed medication as directed.  Please discuss all new medications with your pharmacist.  Do not take additional tylenol while taking the prescribed pain medication to avoid overdose.  You may return to the Emergency Department at any time for worsening condition or any new symptoms that concern you.  If you develop high fevers, difficulty swallowing or breathing, or you are unable to tolerate fluids by mouth, return to the ER immediately for a recheck.    ° ° °Pharyngitis °Pharyngitis is redness, pain, and swelling (inflammation) of your pharynx.  °CAUSES  °Pharyngitis is usually caused by infection. Most of the time, these infections are from viruses (viral) and are part of a cold. However, sometimes pharyngitis is caused by bacteria (bacterial). Pharyngitis can also be caused by allergies. Viral pharyngitis may be spread from person to person by coughing, sneezing, and personal items or utensils (cups, forks, spoons, toothbrushes). Bacterial pharyngitis may be spread from person to person by more intimate contact, such as kissing.  °SIGNS AND SYMPTOMS  °Symptoms of pharyngitis include:   °· Sore throat.   °· Tiredness (fatigue).   °· Low-grade fever.   °· Headache. °· Joint pain and muscle aches. °· Skin rashes. °· Swollen lymph nodes. °· Plaque-like film on throat or tonsils (often seen with bacterial pharyngitis). °DIAGNOSIS  °Your health care provider will ask you questions about your illness and your symptoms. Your medical history, along with a physical exam, is often all that is needed to diagnose pharyngitis. Sometimes, a rapid strep test is done. Other lab tests may also be done, depending on the suspected cause.  °TREATMENT  °Viral pharyngitis will usually get better in 3-4 days without the use of medicine. Bacterial pharyngitis is treated with medicines that kill germs (antibiotics).  °HOME CARE INSTRUCTIONS  °· Drink enough water and fluids to keep your urine  clear or pale yellow.   °· Only take over-the-counter or prescription medicines as directed by your health care provider:   °¨ If you are prescribed antibiotics, make sure you finish them even if you start to feel better.   °¨ Do not take aspirin.   °· Get lots of rest.   °· Gargle with 8 oz of salt water (½ tsp of salt per 1 qt of water) as often as every 1-2 hours to soothe your throat.   °· Throat lozenges (if you are not at risk for choking) or sprays may be used to soothe your throat. °SEEK MEDICAL CARE IF:  °· You have large, tender lumps in your neck. °· You have a rash. °· You cough up green, yellow-brown, or bloody spit. °SEEK IMMEDIATE MEDICAL CARE IF:  °· Your neck becomes stiff. °· You drool or are unable to swallow liquids. °· You vomit or are unable to keep medicines or liquids down. °· You have severe pain that does not go away with the use of recommended medicines. °· You have trouble breathing (not caused by a stuffy nose). °MAKE SURE YOU:  °· Understand these instructions. °· Will watch your condition. °· Will get help right away if you are not doing well or get worse. °Document Released: 12/03/2005 Document Revised: 09/23/2013 Document Reviewed: 08/10/2013 °ExitCare® Patient Information ©2015 Exit[employer], LLC. This information is not intended to replace advice given to you by your health care provider. Make sure you discuss any questions you have with your health care provider. ° °

## 2015-09-02 NOTE — ED Notes (Signed)
The pt has had a sore throat since yesterday.  Unknown temp

## 2015-09-05 LAB — CULTURE, GROUP A STREP

## 2015-09-07 ENCOUNTER — Telehealth: Payer: Self-pay | Admitting: Family Medicine

## 2015-09-07 NOTE — Telephone Encounter (Signed)
Mother calling on behalf of patient to retrieve strep results that were taken at [facility] on Friday evening (09/02/2015). She states that a rapid test was done but they were going to "send it off" and was wondering if those results were in yet. I informed mother of the privacy violation if we were to give those results to her and not the pt unless he had stated otherwise in writing. She expressed understanding and we are to contact the pt with these results once applicable at [phone]. Thank you, Dorothey Baseman, ASA

## 2015-09-07 NOTE — Telephone Encounter (Signed)
Dear [name] Team Please call [facility] and tell him the other test they sent out was negative too THANKS! Denny Levy

## 2015-09-08 NOTE — Telephone Encounter (Signed)
Informed mother of pt of below message. Lamonte Sakai, April D, [place]

## 2015-10-12 ENCOUNTER — Telehealth: Payer: Self-pay | Admitting: Family Medicine

## 2015-10-12 NOTE — Telephone Encounter (Signed)
I spoke with him.  He thinks he may have a sexual transmitted disease.  We did not go into a lot of detail over the phone but he agreed to come in for an appointment tomorrow.

## 2015-10-12 NOTE — Telephone Encounter (Signed)
Pt calling and states that [facility] so that he may make an appointment to be seen for his condition. Thank you, Dorothey BasemanSadie Reynolds, ASA

## 2015-10-13 ENCOUNTER — Ambulatory Visit (INDEPENDENT_AMBULATORY_CARE_PROVIDER_SITE_OTHER): Payer: Medicare Other | Admitting: Family Medicine

## 2015-10-13 ENCOUNTER — Other Ambulatory Visit (HOSPITAL_COMMUNITY)
Admission: RE | Admit: 2015-10-13 | Discharge: 2015-10-13 | Disposition: A | Discharge status: Home / Self Care | Payer: Medicare Other | Source: Ambulatory Visit | Attending: Family Medicine | Admitting: Family Medicine

## 2015-10-13 VITALS — BP 124/79 | HR 107 | Temp 98.6°F | Wt 228.0 lb

## 2015-10-13 DIAGNOSIS — Z113 Encounter for screening for infections with a predominantly sexual mode of transmission: Secondary | ICD-10-CM

## 2015-10-13 DIAGNOSIS — A64 Unspecified sexually transmitted disease: Secondary | ICD-10-CM | POA: Diagnosis present

## 2015-10-13 DIAGNOSIS — T148 Other injury of unspecified body region: Secondary | ICD-10-CM | POA: Diagnosis not present

## 2015-10-13 DIAGNOSIS — T148XXA Other injury of unspecified body region, initial encounter: Secondary | ICD-10-CM

## 2015-10-13 DIAGNOSIS — I10 Essential (primary) hypertension: Secondary | ICD-10-CM | POA: Diagnosis not present

## 2015-10-13 LAB — BASIC METABOLIC PANEL
BUN: 7 mg/dL (ref 7–25)
CO2: 27 mmol/L (ref 20–31)
Calcium: 9.2 mg/dL (ref 8.6–10.3)
Chloride: 105 mmol/L (ref 98–110)
Creat: 0.97 mg/dL (ref 0.60–1.35)
GLUCOSE: 98 mg/dL (ref 65–99)
POTASSIUM: 3.9 mmol/L (ref 3.5–5.3)
Sodium: 140 mmol/L (ref 135–146)

## 2015-10-13 LAB — CBC WITH DIFFERENTIAL/PLATELET
Basophils Absolute: 0.1 10*3/uL (ref 0.0–0.1)
Basophils Relative: 1 % (ref 0–1)
Eosinophils Absolute: 0.1 10*3/uL (ref 0.0–0.7)
Eosinophils Relative: 1 % (ref 0–5)
HEMATOCRIT: 42.7 % (ref 39.0–52.0)
HEMOGLOBIN: 14.3 g/dL (ref 13.0–17.0)
LYMPHS PCT: 30 % (ref 12–46)
Lymphs Abs: 2.2 10*3/uL (ref 0.7–4.0)
MCH: 30 pg (ref 26.0–34.0)
MCHC: 33.5 g/dL (ref 30.0–36.0)
MCV: 89.5 fL (ref 78.0–100.0)
MONO ABS: 0.7 10*3/uL (ref 0.1–1.0)
MPV: 9.5 fL (ref 8.6–12.4)
Monocytes Relative: 9 % (ref 3–12)
Neutro Abs: 4.4 10*3/uL (ref 1.7–7.7)
Neutrophils Relative %: 59 % (ref 43–77)
Platelets: 231 10*3/uL (ref 150–400)
RBC: 4.77 MIL/uL (ref 4.22–5.81)
RDW: 12.1 % (ref 11.5–15.5)
WBC: 7.4 10*3/uL (ref 4.0–10.5)

## 2015-10-13 LAB — HIV ANTIBODY (ROUTINE TESTING W REFLEX): HIV 1&2 Ab, 4th Generation: NONREACTIVE

## 2015-10-13 LAB — HEPATITIS C ANTIBODY: HCV Ab: REACTIVE — AB

## 2015-10-13 MED ORDER — CEFTRIAXONE SODIUM 250 MG IJ SOLR
250.0000 mg | Freq: Once | INTRAMUSCULAR | Status: AC
  Administered 2015-10-13: 250 mg via INTRAMUSCULAR

## 2015-10-13 MED ORDER — AZITHROMYCIN 500 MG PO TABS
1000.0000 mg | ORAL_TABLET | Freq: Once | ORAL | Status: AC
  Administered 2015-10-13: 1000 mg via ORAL
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
## 2015-10-14 ENCOUNTER — Other Ambulatory Visit: Payer: Self-pay | Admitting: Family Medicine

## 2015-10-14 DIAGNOSIS — R7689 Other specified abnormal immunological findings in serum: Secondary | ICD-10-CM | POA: Insufficient documentation

## 2015-10-14 DIAGNOSIS — Z113 Encounter for screening for infections with a predominantly sexual mode of transmission: Secondary | ICD-10-CM

## 2015-10-14 DIAGNOSIS — R768 Other specified abnormal immunological findings in serum: Secondary | ICD-10-CM | POA: Insufficient documentation

## 2015-10-14 LAB — RPR

## 2015-10-14 LAB — HEPATITIS C RNA QUANTITATIVE

## 2015-10-14 LAB — URINE CYTOLOGY ANCILLARY ONLY
CHLAMYDIA, DNA PROBE: NEGATIVE
Neisseria Gonorrhea: POSITIVE — AB

## 2016-01-01 ENCOUNTER — Other Ambulatory Visit: Payer: Self-pay | Admitting: Family Medicine

## 2016-01-12 ENCOUNTER — Encounter: Payer: Self-pay | Admitting: Family Medicine

## 2016-01-12 ENCOUNTER — Ambulatory Visit (INDEPENDENT_AMBULATORY_CARE_PROVIDER_SITE_OTHER): Payer: Medicare Other | Admitting: Family Medicine

## 2016-01-12 VITALS — BP 138/91 | HR 108 | Temp 98.6°F | Wt 227.0 lb

## 2016-01-12 DIAGNOSIS — A084 Viral intestinal infection, unspecified: Secondary | ICD-10-CM | POA: Diagnosis not present

## 2016-01-12 NOTE — Progress Notes (Signed)
Subjective:    Patient ID: Dylan Frank, male    DOB: 25-Jan-1986, 30 y.o.   MRN: [id number]  HPI  Patient presents for Same Day Appointment  CC: feeling sick  # Sick:  Started Tuesday morning, sent home from work.   Primarily nauseated, vomiting on Tuesday. Also had muscle aches/fatigue, felt "woozy"  He took yesterday off and started feeling better  Needs a note to go back to work  Denies any current nausea, vomiting, diarrhea, constipation, fevers ROS: no SOB, no cough, no abdominal pain  Social Hx: never smoker  Review of Systems   See HPI for ROS.   Past medical history, surgical, family, and social history reviewed and updated in the EMR as appropriate.  Objective:  BP 138/91 mmHg  Pulse 108  Temp(Src) 98.6 F (37 C) (Oral)  Wt 227 lb (102.967 kg) Vitals and nursing note reviewed  General: no apparent distress  CV: normal rate, regular rhythm (not tachycardic as recorded in vitals above), no murmurs, rubs or gallop.  Resp: clear to auscultation bilaterally, normal effort Abdomen: soft, obese, nontender, nondistended, normal bowel sounds    Assessment & Plan:  1. Viral gastroenteritis Resolving. Return to work note. Encouraged hand hygiene and hydration. Follow up as needed.

## 2016-04-11 ENCOUNTER — Telehealth: Payer: Self-pay | Admitting: Family Medicine

## 2016-04-11 NOTE — Telephone Encounter (Signed)
Pt is calling to speak to Dr. Jennette KettleNeal. He said that this was a private matter and urgent. jw

## 2016-04-11 NOTE — Telephone Encounter (Signed)
Pt will se Dr. Gayla DossJoyner tomorrow @ [address]9am [place], Maryjo RochesterJessica Dawn, [place]

## 2016-04-11 NOTE — Telephone Encounter (Signed)
[phone] (M) Has recurrence of STi symptoms I am not in clinic again this week---will schedule him with  SDA appt--he prefers male provider if possible  I also told him we need to get the blood work I had scheduled for him (hepatitis labs as he screened positive for hep C) He is in agreement Denny LevySara Quinlan Mcfall   RN team Can you schedule him SDA tomorrow--with male provider if possible--and call him back today. [phone] Judie Petit(M) THANKS! Denny LevySara Jaskaran Dauzat

## 2016-04-12 ENCOUNTER — Encounter: Payer: Self-pay | Admitting: Family Medicine

## 2016-04-12 ENCOUNTER — Ambulatory Visit (INDEPENDENT_AMBULATORY_CARE_PROVIDER_SITE_OTHER): Payer: Medicare Other | Admitting: Family Medicine

## 2016-04-12 VITALS — BP 123/72 | HR 86 | Temp 98.4°F | Ht 69.5 in | Wt 218.0 lb

## 2016-04-12 DIAGNOSIS — N489 Disorder of penis, unspecified: Secondary | ICD-10-CM | POA: Insufficient documentation

## 2016-04-12 DIAGNOSIS — R894 Abnormal immunological findings in specimens from other organs, systems and tissues: Secondary | ICD-10-CM | POA: Diagnosis not present

## 2016-04-12 DIAGNOSIS — N342 Other urethritis: Secondary | ICD-10-CM | POA: Insufficient documentation

## 2016-04-12 DIAGNOSIS — Z114 Encounter for screening for human immunodeficiency virus [HIV]: Secondary | ICD-10-CM | POA: Diagnosis not present

## 2016-04-12 DIAGNOSIS — Z113 Encounter for screening for infections with a predominantly sexual mode of transmission: Secondary | ICD-10-CM

## 2016-04-12 DIAGNOSIS — N4889 Other specified disorders of penis: Secondary | ICD-10-CM

## 2016-04-12 DIAGNOSIS — R768 Other specified abnormal immunological findings in serum: Secondary | ICD-10-CM

## 2016-04-12 LAB — HEPATITIS C ANTIBODY: HCV AB: NEGATIVE

## 2016-04-12 LAB — HEPATITIS B SURFACE ANTIGEN: HEP B S AG: NEGATIVE

## 2016-04-12 LAB — HEPATITIS B CORE ANTIBODY, TOTAL: HEP B C TOTAL AB: NONREACTIVE

## 2016-04-12 LAB — HEPATITIS A ANTIBODY, TOTAL: HEP A TOTAL AB: NONREACTIVE

## 2016-04-12 LAB — HEPATITIS B SURFACE ANTIBODY, QUANTITATIVE: Hepatitis B-Post: 570 m[IU]/mL

## 2016-04-12 MED ORDER — CEFTRIAXONE SODIUM 1 G IJ SOLR
250.0000 mg | Freq: Once | INTRAMUSCULAR | Status: AC
  Administered 2016-04-12: 250 mg via INTRAMUSCULAR

## 2016-04-12 MED ORDER — AZITHROMYCIN 500 MG PO TABS
1000.0000 mg | ORAL_TABLET | Freq: Once | ORAL | Status: AC
  Administered 2016-04-12: 1000 mg via ORAL

## 2016-04-12 NOTE — Patient Instructions (Addendum)
Your symptoms are consistent with urethritis. This is usually caused by sexually transmitted disease. You were tested and treated for gonorrhea and Chlamydia.  - I will call you with the results of your test  You also have a previous positive hepatitis C test.  - I will recheck this today and test for other hepatitis viruses and notify you of the results.   Urethritis, Adult Urethritis is an inflammation of the tube through which urine exits your bladder (urethra).  CAUSES Urethritis is often caused by an infection in your urethra. The infection can be viral, like herpes. The infection can also be bacterial, like gonorrhea. RISK FACTORS Risk factors of urethritis include:  Having sex without using a condom.  Having multiple sexual partners.  Having poor hygiene. SIGNS AND SYMPTOMS Symptoms of urethritis are less noticeable in women than in men. These symptoms include:  Burning feeling when you urinate (dysuria).  Discharge from your urethra.  Blood in your urine (hematuria).  Urinating more than usual. DIAGNOSIS  To confirm a diagnosis of urethritis, your health care provider will do the following:  Ask about your sexual history.  Perform a physical exam.  Have you provide a sample of your urine for lab testing.  Use a cotton swab to gently collect a sample from your urethra for lab testing. TREATMENT  It is important to treat urethritis. Depending on the cause, untreated urethritis may lead to serious genital infections and possibly infertility. Urethritis caused by a bacterial infection is treated with antibiotic medicine. All sexual partners must be treated.  HOME CARE INSTRUCTIONS  Do not have sex until the test results are known and treatment is completed, even if your symptoms go away before you finish treatment.  If you were prescribed an antibiotic, finish it all even if you start to feel better. SEEK MEDICAL CARE IF:   Your symptoms are not improved in 3  days.  Your symptoms are getting worse.  You develop abdominal pain or pelvic pain (in women).  You develop joint pain.  You have a fever. SEEK IMMEDIATE MEDICAL CARE IF:   You have severe pain in the belly, back, or side.  You have repeated vomiting. MAKE SURE YOU:  Understand these instructions.  Will watch your condition.  Will get help right away if you are not doing well or get worse.   This information is not intended to replace advice given to you by your health care provider. Make sure you discuss any questions you have with your health care provider.   Document Released: 05/29/2001 Document Revised: 04/19/2015 Document Reviewed: 08/03/2013 Elsevier Interactive Patient Education [employer]2016 Elsevier Inc.

## 2016-04-12 NOTE — Assessment & Plan Note (Signed)
History of positive hepatitis C antibody test - Check hepatitis A & B and hep C Quant

## 2016-04-12 NOTE — Assessment & Plan Note (Signed)
Tender solitary penile papule not consistent with HSV or Syphilis. 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  - Check HSV serology    - Check HIV, RPR - Advised follow-up if lesion becomes ulcerative or does not resolve with treatment with ceftriaxone and azithromycin

## 2016-04-12 NOTE — Progress Notes (Signed)
Subjective:    Patient ID: Dylan Frank, male    DOB: Jul 20, 1986, 30 y.o.   MRN: [id number]  Seen for Same day visit for   CC: penile discharge  He reports pain with urination, purulent penile discharge for the past week.  Denies fever, rectal pain, abdominal pain or testicular pain.  Also reports penile lesion that is painful and tender.  Reports being sexually active with new partner in the past 60 days without protection, and has engaged in penetrative anal sex and oral sex with men. Reports Hx of STDs.  Denies sex for money or drugs, or previous IV or snorted drug use.   Smoking Hx noted.  Review of Systems   See HPI for ROS. Objective:  BP 123/72 mmHg  Pulse 86  Temp(Src) 98.4 F (36.9 C) (Oral)  Ht 5' 9.5" (1.765 m)  Wt 218 lb (98.884 kg)  BMI 31.74 kg/m2  SpO2 95%  General: NAD Cardiac: RRR, normal heart sounds, no murmurs. 2+ radial and PT pulses bilaterally Respiratory: CTAB, normal effort Abdomen: soft, nontender, nondistended, Bowel sounds present GU: 0.5cm tender papular lesion without ulceration at base of penis. No penile discharge.  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 No rectal, pelvic or abdominal pain.  - Check gonorrhea, chlamydia - Ceftriaxone 250 IM, azithromycin 1 g by mouth - Check HIV, RPR - Advised follow-up if symptoms not resolved in 3 days. Would check prostate and prolong abx at that time.   Penile lesion Tender solitary penile papule not consistent with HSV or Syphilis. 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  - Check HSV serology    - Check HIV, RPR - Advised follow-up if lesion becomes ulcerative or does not resolve with treatment with ceftriaxone and azithromycin

## 2016-04-12 NOTE — Assessment & Plan Note (Signed)
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 No rectal, pelvic or abdominal pain.  - Check gonorrhea, chlamydia - Ceftriaxone 250 IM, azithromycin 1 g by mouth - Check HIV, RPR - Advised follow-up if symptoms not resolved in 3 days. Would check prostate and prolong abx at that time.

## 2016-04-13 LAB — HIV ANTIBODY (ROUTINE TESTING W REFLEX): HIV 1&2 Ab, 4th Generation: NONREACTIVE

## 2016-04-13 LAB — RPR

## 2016-04-16 ENCOUNTER — Telehealth: Payer: Self-pay | Admitting: Family Medicine

## 2016-04-16 NOTE — Telephone Encounter (Signed)
Called and discussed results from recent STD check.  - His dysuria has resolved.  - Hep C was Negative as well as Hep A & B. Possible prior Hep C was false positive. 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 He denies ulcer lesion (past or present) on his lips or genitals. He does still have non tender penile papule that he reports is resolving.  Advised him to follow-up with Dr Jennette KettleNeal in next 1-2 weeks if not resolved.  - Recommended condom use with all sexual encounters.

## 2016-04-17 LAB — HSV(HERPES SMPLX)ABS-I+II(IGG+IGM)-BLD
HERPES SIMPLEX VRS I-IGM AB (EIA): 0.7 {index}
HSV 1 Glycoprotein G Ab, IgG: 18.9 Index — ABNORMAL HIGH (ref <0.90)
HSV 2 Glycoprotein G Ab, IgG: <0.9 Index (ref <0.90)

## 2016-07-22 ENCOUNTER — Emergency Department (HOSPITAL_COMMUNITY)
Admission: EM | Admit: 2016-07-22 | Discharge: 2016-07-22 | Disposition: A | Discharge status: Home / Self Care | Payer: Medicare Other | Attending: Emergency Medicine | Admitting: Emergency Medicine

## 2016-07-22 ENCOUNTER — Encounter (HOSPITAL_COMMUNITY): Payer: Self-pay | Admitting: *Deleted

## 2016-07-22 DIAGNOSIS — I10 Essential (primary) hypertension: Secondary | ICD-10-CM | POA: Insufficient documentation

## 2016-07-22 DIAGNOSIS — F909 Attention-deficit hyperactivity disorder, unspecified type: Secondary | ICD-10-CM | POA: Insufficient documentation

## 2016-07-22 DIAGNOSIS — F840 Autistic disorder: Secondary | ICD-10-CM | POA: Diagnosis not present

## 2016-07-22 DIAGNOSIS — T63444A Toxic effect of venom of bees, undetermined, initial encounter: Secondary | ICD-10-CM

## 2016-07-22 DIAGNOSIS — J45909 Unspecified asthma, uncomplicated: Secondary | ICD-10-CM | POA: Insufficient documentation

## 2016-07-22 DIAGNOSIS — Z79899 Other long term (current) drug therapy: Secondary | ICD-10-CM | POA: Diagnosis not present

## 2016-07-22 DIAGNOSIS — T63441A Toxic effect of venom of bees, accidental (unintentional), initial encounter: Secondary | ICD-10-CM | POA: Insufficient documentation

## 2016-07-22 MED ORDER — DIPHENHYDRAMINE HCL 25 MG PO CAPS
25.0000 mg | ORAL_CAPSULE | Freq: Once | ORAL | Status: AC
  Administered 2016-07-22: 25 mg via ORAL
  Filled 2016-07-22: qty 1

## 2016-07-22 MED ORDER — ACETAMINOPHEN 500 MG PO TABS
1000.0000 mg | ORAL_TABLET | Freq: Once | ORAL | Status: AC
  Administered 2016-07-22: 1000 mg via ORAL
  Filled 2016-07-22: qty 2

## 2016-07-22 NOTE — ED Triage Notes (Signed)
Pt reports bee sting to right side of neck pta. Did not take any meds pta. No swelling noted, no acute distress noted at triage. Reports only being stung by bees many years ago and had syncopal episode that time due to having multiple stings.

## 2016-07-22 NOTE — Discharge Instructions (Signed)
1 to 2 tablets of 25 mg Benadryl pills every 4-6 hours as needed to a maximum of 300 mg per day. In addition, you may apply a topical hydrocortisone ointment to all affected areas except for the face.  ° °Do not hesitate to call 911 or return to the emergency room if you develop any shortness of breath, wheezing, tongue or lip swelling. ° °Please follow with your primary care doctor in the next 2 days for a check-up. They must obtain records for further management.  ° °Do not hesitate to return to the Emergency Department for any new, worsening or concerning symptoms.  ° ° °

## 2016-07-22 NOTE — ED Notes (Addendum)
Bee sting to right neck < 30 min. Ago. Pt reports hx of "ALLERGIC REACTION"after multiple bee stings. Today, no SOB, no hives, no tingling to mouth at present. HX OF AUTISM.

## 2016-07-22 NOTE — ED Provider Notes (Signed)
[facility]-EMERGENCY DEPT Provider Note   CSN: [id number] Arrival date & time: 07/22/16  1347  First Provider Contact:  First MD Initiated Contact with Patient 07/22/16 1400     By signing my name below, I, Dylan Frank, attest that this documentation has been prepared under the direction and in the presence of [employer], PA-C.  Electronically Signed: Rosario Frank, ED Scribe. 07/22/16. 2:44 PM.   History   Chief Complaint Chief Complaint  Patient presents with  . Insect Bite   The history is provided by the patient. No language interpreter was used.   HPI Comments: Dylan Frank is a 30 y.o. male who presents to the Emergency Department s/p bee sting to the right-side of his neck that occurred PTA. He reports associated, mild light-headedness and diaphoresis since the incident. Pt reports that he had an allergic reaction to multiple bee stings at once in the past, and during that episode he became syncopal. He denies LOC, SOB, rash, or nausea during this episode. No alleviating factors noted.     Past Medical History:  Diagnosis Date  . ADHD (attention deficit hyperactivity disorder)   . Asthma    prn inhaler  . Autism   . Depression   . Gynecomastia, male 05/2013  . History of cardiac murmur    as a child - states no problems; no murmur, per PCP note 10/22/2012  . Hypertension    under control with med., has been on med. x 3 mos.  . Wears glasses     Patient Active Problem List   Diagnosis Date Noted  . Penile lesion 04/12/2016  . Urethritis 04/12/2016  . Positive hepatitis C antibody test 10/14/2015  . Viral illness 04/02/2014  . Gynecomastia, male 02/11/2013  . Conduct disorder, 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  . Metabolic syndrome 07/01/2012  . Medication management 07/01/2012  . HTN (hypertension) 07/01/2012  . ATTENTION DEFICIT, W/HYPERACTIVITY 02/13/2007  . ASTHMA, UNSPECIFIED 02/13/2007   Past Surgical History:  Procedure Laterality Date  .  BREAST REDUCTION SURGERY Bilateral 05/25/2013   Procedure: BILATERAL BREAST REDUCTION WITH LIPO ASSISTANCE left breast;  Surgeon: Louisa Second, MD;  Location:  S[facility];  Service: Plastics;  Laterality: Bilateral;  . CIRCUMCISION     age 48  . MEATOTOMY     age 48    Home Medications    Prior to Admission medications   Medication Sig Start Date End Date Taking? Authorizing Provider  albuterol (PROVENTIL HFA;VENTOLIN HFA) 108 (90 BASE) MCG/ACT inhaler Inhale 2 puffs into the lungs every 6 (six) hours as needed for wheezing. 03/02/14   Nestor Ramp, MD  cephALEXin (KEFLEX) 500 MG capsule Take 1 capsule (500 mg total) by mouth 3 (three) times daily. 04/01/14   Nestor Ramp, MD  divalproex (DEPAKOTE ER) 500 MG 24 hr tablet Take 500 mg by mouth every evening.     Historical Provider, MD  fluticasone (FLONASE) 50 MCG/ACT nasal spray Place 1 spray into both nostrils daily. 12/23/13   Nestor Ramp, MD  HYDROcodone-acetaminophen (HYCET) 7.5-325 mg/15 ml solution Take 10 mLs by mouth 4 (four) times daily as needed for moderate pain or severe pain. 09/02/15   Trixie Dredge, PA-C  ibuprofen (ADVIL,MOTRIN) 800 MG tablet Take 1 tablet (800 mg total) by mouth every 8 (eight) hours as needed for mild pain or moderate pain. 09/02/15   Trixie Dredge, PA-C  lisinopril (PRINIVIL,ZESTRIL) 10 MG tablet TAKE 1 TABLET BY MOUTH DAILY 01/02/16   Roxine Caddy  Jennette Kettle, MD  methylphenidate (CONCERTA) 18 MG CR tablet Take 18 mg by mouth every morning.    Historical Provider, MD  QUEtiapine (SEROQUEL) 400 MG tablet Take 400 mg by mouth at bedtime.     Historical Provider, MD   Family History Family History  Problem Relation Age of Onset  . Diabetes Mother   . Other Mother     visually impaired   Social History Social History  Substance Use Topics  . Smoking status: Never Smoker  . Smokeless tobacco: Never Used  . Alcohol use Yes   Allergies   Review of patient's allergies indicates no known allergies.  Review of  Systems Review of Systems A complete 10 system review of systems was obtained and all systems are negative except as noted in the HPI and PMH.   Physical Exam Updated Vital Signs BP 154/98 (BP Location: Right Arm)   Pulse 74   Temp 98.2 F (36.8 C) (Oral)   Resp 22   SpO2 100%   Physical Exam  Constitutional: He appears well-developed and well-nourished.  HENT:  Head: Normocephalic.  Eyes: Conjunctivae are normal.  Cardiovascular: Normal rate.   Pulmonary/Chest: Effort normal and breath sounds normal. No respiratory distress. He has no wheezes.  No stridor or drooling. No posterior pharynx edema, lip or tongue swelling. Pt reclining comfortably, speaking in complete sentences.   No wheezing, excellent air movement in all fields.    Abdominal: He exhibits no distension.  Musculoskeletal: Normal range of motion.  Neurological: He is alert.  Skin: Skin is warm and dry. No rash noted.  Psychiatric: He has a normal mood and affect. His behavior is normal.  Nursing note and vitals reviewed.  ED Treatments / Results  DIAGNOSTIC STUDIES: Oxygen Saturation is 100% on RA, normal by my interpretation.   COORDINATION OF CARE: 2:35 PM-Discussed next steps with pt. Pt verbalized understanding and is agreeable with the plan.   Labs (all labs ordered are listed, but only abnormal results are displayed) Labs Reviewed - No data to display  EKG  EKG Interpretation None      Radiology No results found.  Procedures Procedures (including critical care time)  Medications Ordered in ED Medications  acetaminophen (TYLENOL) tablet 1,000 mg (1,000 mg Oral Given 07/22/16 1454)  diphenhydrAMINE (BENADRYL) capsule 25 mg (25 mg Oral Given 07/22/16 1455)     Initial Impression / Assessment and Plan / ED Course  I have reviewed the triage vital signs and the nursing notes.  Pertinent labs & imaging results that were available during my care of the patient were reviewed by me and  considered in my medical decision making (see chart for details).  Clinical Course    Vitals:   07/22/16 1353  BP: 154/98  Pulse: 74  Resp: 22  Temp: 98.2 F (36.8 C)  TempSrc: Oral  SpO2: 100%    Medications  acetaminophen (TYLENOL) tablet 1,000 mg (1,000 mg Oral Given 07/22/16 1454)  diphenhydrAMINE (BENADRYL) capsule 25 mg (25 mg Oral Given 07/22/16 1455)    Dylan Frank is 30 y.o. male presenting with A bee sting to left lateral neck. Patient with no rash, shortness of breath, wheezing, nausea vomiting, no signs of secondary organ involvement, no indication for epinephrine administration. Patient has been observed in the ED with no worsening of symptoms, states he feels slightly lightheaded but patient ambulated without issue, blood pressure remains strong, will give Benadryl and Tylenol for discomfort.  Evaluation does not show pathology that would require  ongoing emergent intervention or inpatient treatment. Pt is hemodynamically stable and mentating appropriately. Discussed findings and plan with patient/guardian, who agrees with care plan. All questions answered. Return precautions discussed and outpatient follow up given.     Final Clinical Impressions(s) / ED Diagnoses   Final diagnoses:  Bee sting, undetermined intent, initial encounter    New Prescriptions Discharge Medication List as of 07/22/2016  3:00 PM     I personally performed the services described in this documentation, which was scribed in my presence. The recorded information has been reviewed and is accurate.     Wynetta Emeryicole Amarissa Koerner, PA-C 07/22/16 1536    Vanetta MuldersScott Zackowski, MD 07/23/16 [phone]

## 2016-07-22 NOTE — ED Notes (Signed)
Pt given sandwich and beverage.

## 2016-08-03 ENCOUNTER — Ambulatory Visit (INDEPENDENT_AMBULATORY_CARE_PROVIDER_SITE_OTHER): Payer: Medicare Other | Admitting: Student

## 2016-08-03 ENCOUNTER — Other Ambulatory Visit (HOSPITAL_COMMUNITY)
Admission: RE | Admit: 2016-08-03 | Discharge: 2016-08-03 | Disposition: A | Discharge status: Home / Self Care | Payer: Medicare Other | Source: Ambulatory Visit | Attending: Family Medicine | Admitting: Family Medicine

## 2016-08-03 ENCOUNTER — Encounter: Payer: Self-pay | Admitting: Student

## 2016-08-03 VITALS — BP 133/86 | HR 102 | Temp 98.0°F | Wt 214.6 lb

## 2016-08-03 DIAGNOSIS — R300 Dysuria: Secondary | ICD-10-CM | POA: Diagnosis present

## 2016-08-03 DIAGNOSIS — Z113 Encounter for screening for infections with a predominantly sexual mode of transmission: Secondary | ICD-10-CM | POA: Insufficient documentation

## 2016-08-03 DIAGNOSIS — Z7251 High risk heterosexual behavior: Secondary | ICD-10-CM | POA: Diagnosis not present

## 2016-08-03 LAB — POCT URINALYSIS DIPSTICK
Glucose, UA: NEGATIVE
KETONES UA: 15
Nitrite, UA: NEGATIVE
PROTEIN UA: 30
RBC UA: NEGATIVE
pH, UA: 6

## 2016-08-03 LAB — POCT UA - MICROSCOPIC ONLY

## 2016-08-03 LAB — HEPATITIS C ANTIBODY: HCV AB: NEGATIVE

## 2016-08-03 LAB — HEPATITIS B SURFACE ANTIGEN: HEP B S AG: NEGATIVE

## 2016-08-03 NOTE — Patient Instructions (Signed)
It was great seeing you today! We have addressed the following issues today  1. Burning with urination: I have ordered some tests today. We will call you if the results are positive. Otherwise, you will get a letter in mail about your test results.    If we did any lab work today, and the results require attention, either me or my nurse will get in touch with you. If everything is normal, you will get a letter in mail. If you don't hear from us in two weeks, please give us a call. Otherwise, I look forward to talking with you again at our next visit. If you have any questions or concerns before then, please call the clinic at [phone].  Please bring all your medications to every doctors visit   Sign up for My Chart to have easy access to your labs results, and communication with your Primary care physician.    Please check-out at the front desk before leaving the clinic.   Take Care,

## 2016-08-03 NOTE — Progress Notes (Signed)
   Subjective:    Patient ID: Dylan Frank is a 30 y.o. old male.  CC: Burning with urination  HPI #Burning with urination: this has been going on for two weeks. He has dysuria everytime he urinates. Also with ejaculation. Denies fever, chills, back pain, penile or perineal lesion, low abdominal pain, swelling in scrotum and penile discharge. Urine appears dark sometimes but no blood.   Sexually active with male partner. About 4 male partners in the last 6 months. Occasionally uses condom. Not receptive. Oral sex sometimes. Denies sorethroat or lesion in mouth.   PMH: was treated for gonorrhea about 6 months ago.  SH: homosexual with multiple partners  Review of Systems Per HPI Objective:   Vitals:   08/03/16 1032  BP: 133/86  Pulse: (!) 102  Temp: 98 F (36.7 C)  TempSrc: Oral  Weight: 214 lb 9.6 oz (97.3 kg)    GEN: appears well, no apparent distress. HEENT:   Head: normocephalic and atraumatic  Eyes: without conjunctival injection, sclera anicteric,   Oropharynx: mmm without erythema or exudation CVS: RRR, normal s1 and s2, no murmurs, no edema RESP: no increased work of breathing, good air movement bilaterally, no crackles or wheeze GI: soft, non-tender,non-distended, +BS,  GU: negative for penile lesion, scrotal lesion, testicular swelling, inguinal LAD, apparent penile discharge, perianal lesion. No suprapubic or CVA tenderness SKIN: no perineal lesion HEM: negative for cervical, axillary or inguinal LAD NEURO: alert and oriented appropriately, no gross defecits  PSYCH: appears nervous    Assessment & Plan:  Dysuria Patient with dysuria concerning for UTI. However, UA negative except for trace LE. He has no systemic symptoms to think of pyelo. His risky sexual behavior and history of STD concerning for STI.  -Urine for urine culture -HIV, HepBsAg and Hep C Ab, RPR, GC/CT today -Discussed about his risk for STI with multiple sexual partner without consistent  condom use.  -Discussed return precautions

## 2016-08-03 NOTE — Assessment & Plan Note (Addendum)
Patient with dysuria concerning for UTI. However, UA negative except for trace LE. He has no systemic symptoms to think of pyelo. His risky sexual behavior and history of STD concerning for STI.  -Urine for urine culture -HIV, HepBsAg and Hep C Ab, RPR, GC/CT today -Discussed about his risk for STI with multiple sexual partner without consistent condom use.  -Discussed return precautions

## 2016-08-04 LAB — RPR

## 2016-08-04 LAB — HIV ANTIBODY (ROUTINE TESTING W REFLEX): HIV 1&2 Ab, 4th Generation: NONREACTIVE

## 2016-08-05 LAB — URINE CULTURE

## 2016-08-06 ENCOUNTER — Telehealth: Payer: Self-pay | Admitting: Student

## 2016-08-06 LAB — URINE CYTOLOGY ANCILLARY ONLY
Chlamydia: POSITIVE — AB
NEISSERIA GONORRHEA: NEGATIVE

## 2016-08-06 NOTE — Telephone Encounter (Signed)
Called patient to discuss about his results from his recent visits. He states came back positive for chlamydia. Advised patient to come to the clinic for nurse visit for treatment. Also encouraged him to advise his sexual partners to seek treatment. Stressed about safe sex and using protection. Patient voiced understanding and agreed to the plan.

## 2016-08-07 ENCOUNTER — Ambulatory Visit (INDEPENDENT_AMBULATORY_CARE_PROVIDER_SITE_OTHER): Payer: Medicare Other | Admitting: *Deleted

## 2016-08-07 DIAGNOSIS — A749 Chlamydial infection, unspecified: Secondary | ICD-10-CM | POA: Diagnosis not present

## 2016-08-07 MED ORDER — AZITHROMYCIN 500 MG PO TABS
500.0000 mg | ORAL_TABLET | Freq: Once | ORAL | Status: AC
  Administered 2016-08-07: 500 mg via ORAL

## 2016-08-07 NOTE — Progress Notes (Signed)
   Patient in nurse clinic for chlamydia treatment.  Patient advised not to have sex for the next 7-10 days or until partner has been tested/treated.  Azithromycin 1 gm PO x 1 given per verbal order by Dr. Alanda SlimGonfa.  [facility]  Clovis PuMartin, Keili Hasten L, RN

## 2016-12-26 ENCOUNTER — Telehealth: Payer: Self-pay | Admitting: Family Medicine

## 2016-12-26 NOTE — Telephone Encounter (Signed)
Pt would like to talk to dr neal about a personal issue. He has an appt next Wednesday and he would not say what the personal issue

## 2016-12-28 NOTE — Telephone Encounter (Signed)
Ok I called and left ANOTHER VM Denny LevySara Rosie Golson

## 2016-12-28 NOTE — Telephone Encounter (Signed)
Left voice mail message for him to call me back Denny LevySara Neal

## 2016-12-28 NOTE — Telephone Encounter (Signed)
Pt was returning your call. ep

## 2016-12-31 NOTE — Telephone Encounter (Signed)
Dear Cliffton AstersWhite Team Please call him and see what he needs. [facility] so if he needs to be seen I can set him up with someone else or we can try to handle over the phone. I have tried calling him several times THANKS! Denny LevySara Mamie Diiorio

## 2017-01-01 NOTE — Telephone Encounter (Signed)
Spoke with patient he was reluctant to share why he needed to speak with dr neal but after some probing he decided to tell. Patient believes he may have an STD, symptoms started 2-3 weeks ago he has some burning with urination. Patient has an appointment with Dr. Earlene PlaterWallace on 01/03/17. Will forward this to her so she will know what to expect when patient does come in. Maryjean Mornempestt S Roberts, CMA

## 2017-01-02 ENCOUNTER — Ambulatory Visit: Payer: Medicare Other | Admitting: Family Medicine

## 2017-01-03 ENCOUNTER — Ambulatory Visit: Payer: Medicare Other | Admitting: Internal Medicine

## 2017-01-10 ENCOUNTER — Other Ambulatory Visit (HOSPITAL_COMMUNITY)
Admission: RE | Admit: 2017-01-10 | Discharge: 2017-01-10 | Disposition: A | Discharge status: Home / Self Care | Payer: Medicare Other | Source: Ambulatory Visit | Attending: Family Medicine | Admitting: Family Medicine

## 2017-01-10 ENCOUNTER — Other Ambulatory Visit: Payer: Self-pay | Admitting: Family Medicine

## 2017-01-10 ENCOUNTER — Ambulatory Visit (INDEPENDENT_AMBULATORY_CARE_PROVIDER_SITE_OTHER): Payer: Medicare Other | Admitting: Family Medicine

## 2017-01-10 DIAGNOSIS — N342 Other urethritis: Secondary | ICD-10-CM | POA: Diagnosis not present

## 2017-01-10 DIAGNOSIS — R300 Dysuria: Secondary | ICD-10-CM

## 2017-01-10 DIAGNOSIS — Z113 Encounter for screening for infections with a predominantly sexual mode of transmission: Secondary | ICD-10-CM | POA: Insufficient documentation

## 2017-01-10 DIAGNOSIS — Z7251 High risk heterosexual behavior: Secondary | ICD-10-CM | POA: Diagnosis not present

## 2017-01-10 MED ORDER — CEFTRIAXONE SODIUM 250 MG IJ SOLR
250.0000 mg | Freq: Once | INTRAMUSCULAR | Status: AC
Start: 2017-01-10 — End: 2017-01-10
  Administered 2017-01-10: 250 mg via INTRAMUSCULAR

## 2017-01-10 MED ORDER — AZITHROMYCIN 1 G PO PACK
1.0000 g | PACK | Freq: Once | ORAL | Status: AC
  Administered 2017-01-10: 1 g via ORAL

## 2017-01-10 NOTE — Patient Instructions (Signed)
Based on your symptoms, I treated you today for gonorrhea and chlamydia.   I will call, probably tomorrow, with the test results. Your symptoms should clear up in a few days. Get back with Dr. Jennette KettleNeal if you remain down for weeks about this issue.

## 2017-01-11 ENCOUNTER — Encounter: Payer: Self-pay | Admitting: Family Medicine

## 2017-01-11 LAB — URINE CYTOLOGY ANCILLARY ONLY
Chlamydia: POSITIVE — AB
Neisseria Gonorrhea: POSITIVE — AB

## 2017-01-11 NOTE — Assessment & Plan Note (Addendum)
Would be a good candidate for prep therapy Test for all STDs

## 2017-01-11 NOTE — Assessment & Plan Note (Addendum)
Plus known STD exposure.  Likely GC or chlamydia.  Treat presumptively and test for all STDs

## 2017-01-11 NOTE — Progress Notes (Signed)
Subjective:    Patient ID: Dylan Frank, male    DOB: 1986-04-03, 31 y.o.   MRN: [id number]  HPI Patient had urethral DC about one week ago.  Resolved and now has mild dysuria.  His male partner also told him that he (the partner) was diagnosed with an STD (patient does not know which one.)  No sores, no fever, no general malaise, just dysuria.    Review of Systems     Objective:   Physical Exam Normal male without discharge, tenderness or skin lesions.       Assessment & Plan:

## 2017-01-16 ENCOUNTER — Ambulatory Visit: Payer: Medicare Other | Admitting: Family Medicine

## 2017-01-16 DIAGNOSIS — H5213 Myopia, bilateral: Secondary | ICD-10-CM | POA: Diagnosis not present

## 2017-01-16 DIAGNOSIS — Z961 Presence of intraocular lens: Secondary | ICD-10-CM | POA: Diagnosis not present

## 2017-01-26 ENCOUNTER — Other Ambulatory Visit: Payer: Self-pay | Admitting: Family Medicine

## 2017-01-28 ENCOUNTER — Encounter: Payer: Self-pay | Admitting: Family Medicine

## 2017-02-13 DIAGNOSIS — Z5181 Encounter for therapeutic drug level monitoring: Secondary | ICD-10-CM | POA: Diagnosis not present

## 2017-02-14 LAB — RPR: RPR Ser Ql: NONREACTIVE

## 2017-02-14 LAB — HIV ANTIBODY (ROUTINE TESTING W REFLEX): HIV Screen 4th Generation wRfx: NONREACTIVE

## 2017-03-26 ENCOUNTER — Encounter (HOSPITAL_COMMUNITY): Payer: Self-pay | Admitting: Emergency Medicine

## 2017-03-26 ENCOUNTER — Emergency Department (HOSPITAL_COMMUNITY)
Admission: EM | Admit: 2017-03-26 | Discharge: 2017-03-26 | Disposition: A | Discharge status: Home / Self Care | Payer: Medicare Other | Attending: Emergency Medicine | Admitting: Emergency Medicine

## 2017-03-26 ENCOUNTER — Emergency Department (HOSPITAL_COMMUNITY): Payer: Medicare Other

## 2017-03-26 DIAGNOSIS — R0789 Other chest pain: Secondary | ICD-10-CM | POA: Diagnosis not present

## 2017-03-26 DIAGNOSIS — Z79899 Other long term (current) drug therapy: Secondary | ICD-10-CM | POA: Diagnosis not present

## 2017-03-26 DIAGNOSIS — F840 Autistic disorder: Secondary | ICD-10-CM | POA: Diagnosis not present

## 2017-03-26 DIAGNOSIS — J45909 Unspecified asthma, uncomplicated: Secondary | ICD-10-CM | POA: Insufficient documentation

## 2017-03-26 DIAGNOSIS — I10 Essential (primary) hypertension: Secondary | ICD-10-CM | POA: Diagnosis not present

## 2017-03-26 DIAGNOSIS — F909 Attention-deficit hyperactivity disorder, unspecified type: Secondary | ICD-10-CM | POA: Diagnosis not present

## 2017-03-26 DIAGNOSIS — R079 Chest pain, unspecified: Secondary | ICD-10-CM | POA: Diagnosis not present

## 2017-03-26 HISTORY — DX: Unspecified convulsions: R56.9

## 2017-03-26 LAB — CBC
HCT: 39.3 % (ref 39.0–52.0)
Hemoglobin: 12.8 g/dL — ABNORMAL LOW (ref 13.0–17.0)
MCH: 30.1 pg (ref 26.0–34.0)
MCHC: 32.6 g/dL (ref 30.0–36.0)
MCV: 92.5 fL (ref 78.0–100.0)
PLATELETS: 235 10*3/uL (ref 150–400)
RBC: 4.25 MIL/uL (ref 4.22–5.81)
RDW: 11.6 % (ref 11.5–15.5)
WBC: 7.1 10*3/uL (ref 4.0–10.5)

## 2017-03-26 LAB — COMPREHENSIVE METABOLIC PANEL
ALK PHOS: 70 U/L (ref 38–126)
ALT: 11 U/L — AB (ref 17–63)
ANION GAP: 11 (ref 5–15)
AST: 23 U/L (ref 15–41)
Albumin: 3.5 g/dL (ref 3.5–5.0)
BUN: 6 mg/dL (ref 6–20)
CALCIUM: 9.1 mg/dL (ref 8.9–10.3)
CHLORIDE: 104 mmol/L (ref 101–111)
CO2: 25 mmol/L (ref 22–32)
CREATININE: 0.91 mg/dL (ref 0.61–1.24)
Glucose, Bld: 84 mg/dL (ref 65–99)
Potassium: 3.7 mmol/L (ref 3.5–5.1)
SODIUM: 140 mmol/L (ref 135–145)
Total Bilirubin: 0.6 mg/dL (ref 0.3–1.2)
Total Protein: 7.2 g/dL (ref 6.5–8.1)

## 2017-03-26 LAB — TROPONIN I

## 2017-03-26 LAB — MAGNESIUM: MAGNESIUM: 1.8 mg/dL (ref 1.7–2.4)

## 2017-03-26 MED ORDER — KETOROLAC TROMETHAMINE 30 MG/ML IJ SOLN
15.0000 mg | Freq: Once | INTRAMUSCULAR | Status: AC
  Administered 2017-03-26: 15 mg via INTRAVENOUS
  Filled 2017-03-26: qty 1

## 2017-03-26 MED ORDER — ASPIRIN 81 MG PO CHEW: 324.0000 mg | CHEWABLE_TABLET | Freq: Once | ORAL | Status: DC

## 2017-03-26 MED ORDER — IBUPROFEN 400 MG PO TABS
400.0000 mg | ORAL_TABLET | Freq: Three times a day (TID) | ORAL | 0 refills | Status: AC
Start: 2017-03-26 — End: 2017-03-29

## 2017-03-26 NOTE — ED Triage Notes (Signed)
Pt to ED via [facility] with c/o central chest pain onset yesterday.  Pt st's pain increases with deep breathing and cough

## 2017-03-26 NOTE — ED Notes (Signed)
Pt given sandwich bag and PO fluids per Jeraldine Loots, EDP verbal order.

## 2017-03-26 NOTE — ED Provider Notes (Signed)
[facility]Y [facility]PT Provider Note   CSN: [id number] Arrival date & time: 03/26/17  1826     History   Chief Complaint Chief Complaint  Patient presents with  . Chest Pain    HPI Dylan Frank is a 31 y.o. male.  HPI  Patient presents with concern of chest pain. Pain is substernal, nonradiating, sore, worse with activity, movement, inspiration. Onset was yesterday, since onset symptoms of been persistent, with no clear alleviating factors, and no specific medication taken for relief. No new  dyspnea, no fever, minimal cough, which also makes the pain worse. Patient denies history of coronary disease.   Past Medical History:  Diagnosis Date  . ADHD (attention deficit hyperactivity disorder)   . Asthma    prn inhaler  . Autism   . Depression   . Gynecomastia, male 05/2013  . History of cardiac murmur    as a child - states no problems; no murmur, per PCP note 10/22/2012  . Hypertension    under control with med., has been on med. x 3 mos.  . Seizures (HCC)   . Wears glasses     Patient Active Problem List   Diagnosis Date Noted  . Dysuria 08/03/2016  . High risk sexual behavior 08/03/2016  . Penile lesion 04/12/2016  . Urethritis 04/12/2016  . Positive hepatitis C antibody test 10/14/2015  . Viral illness 04/02/2014  . Gynecomastia, male 02/11/2013  . Conduct disorder, 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  . Metabolic syndrome 07/01/2012  . Medication management 07/01/2012  . HTN (hypertension) 07/01/2012  . ATTENTION DEFICIT, W/HYPERACTIVITY 02/13/2007  . ASTHMA, UNSPECIFIED 02/13/2007    Past Surgical History:  Procedure Laterality Date  . BREAST REDUCTION SURGERY Bilateral 05/25/2013   Procedure: BILATERAL BREAST REDUCTION WITH LIPO ASSISTANCE left breast;  Surgeon: Louisa Second, MD;  Location: [facility];  Service: Plastics;  Laterality: Bilateral;  . CIRCUMCISION     age 61  . MEATOTOMY     age 61       Home Medications    Prior to  Admission medications   Medication Sig Start Date End Date Taking? Authorizing Provider  albuterol (PROVENTIL HFA;VENTOLIN HFA) 108 (90 BASE) MCG/ACT inhaler Inhale 2 puffs into the lungs every 6 (six) hours as needed for wheezing. 03/02/14   Nestor Ramp, MD  cephALEXin (KEFLEX) 500 MG capsule Take 1 capsule (500 mg total) by mouth 3 (three) times daily. 04/01/14   Nestor Ramp, MD  divalproex (DEPAKOTE ER) 500 MG 24 hr tablet Take 500 mg by mouth every evening.     Historical Provider, MD  fluticasone (FLONASE) 50 MCG/ACT nasal spray Place 1 spray into both nostrils daily. 12/23/13   Nestor Ramp, MD  HYDROcodone-acetaminophen (HYCET) 7.5-325 mg/15 ml solution Take 10 mLs by mouth 4 (four) times daily as needed for moderate pain or severe pain. 09/02/15   Trixie Dredge, PA-C  ibuprofen (ADVIL,MOTRIN) 800 MG tablet Take 1 tablet (800 mg total) by mouth every 8 (eight) hours as needed for mild pain or moderate pain. 09/02/15   Trixie Dredge, PA-C  lisinopril (PRINIVIL,ZESTRIL) 10 MG tablet TAKE 1 TABLET BY MOUTH DAILY 01/28/17   Nestor Ramp, MD  methylphenidate (CONCERTA) 18 MG CR tablet Take 18 mg by mouth every morning.    Historical Provider, MD  QUEtiapine (SEROQUEL) 400 MG tablet Take 400 mg by mouth at bedtime.     Historical Provider, MD    Family History Family History  Problem Relation Age of  Onset  . Diabetes Mother   . Other Mother     visually impaired    Social History Social History  Substance Use Topics  . Smoking status: Never Smoker  . Smokeless tobacco: Never Used  . Alcohol use Yes     Allergies   Mushroom extract complex   Review of Systems Review of Systems  Constitutional:       Per HPI, otherwise negative  HENT:       Per HPI, otherwise negative  Respiratory:       Per HPI, otherwise negative  Cardiovascular:       Per HPI, otherwise negative  Gastrointestinal: Negative for vomiting.  Endocrine:       Negative aside from HPI  Genitourinary:       Neg aside from  HPI   Musculoskeletal:       Per HPI, otherwise negative  Skin: Negative.   Neurological: Negative for syncope.     Physical Exam Updated Vital Signs BP 119/77 (BP Location: Left Arm)   Pulse 88   Temp 98.9 F (37.2 C) (Oral)   Resp (!) 24   Ht 5' 9.5" (1.765 m)   Wt 185 lb (83.9 kg)   SpO2 97%   BMI 26.93 kg/m   Physical Exam  Constitutional: He is oriented to person, place, and time. He appears well-developed. No distress.  HENT:  Head: Normocephalic and atraumatic.  Eyes: Conjunctivae and EOM are normal.  Cardiovascular: Normal rate and regular rhythm.   Pulmonary/Chest: Effort normal. No stridor. No respiratory distress. He exhibits tenderness.  Abdominal: He exhibits no distension.  Musculoskeletal: He exhibits no edema.  Neurological: He is alert and oriented to person, place, and time.  Skin: Skin is warm and dry.  Psychiatric: He has a normal mood and affect.  Slow cognitive capacity  Nursing note and vitals reviewed.    ED Treatments / Results  Labs (all labs ordered are listed, but only abnormal results are displayed) Labs Reviewed  CBC - Abnormal; Notable for the following:       Result Value   Hemoglobin 12.8 (*)    All other components within normal limits  COMPREHENSIVE METABOLIC PANEL - Abnormal; Notable for the following:    ALT 11 (*)    All other components within normal limits  MAGNESIUM  TROPONIN I    EKG  EKG Interpretation  Date/Time:  Tuesday March 26 2017 18:37:24 EDT Ventricular Rate:  90 PR Interval:    QRS Duration: 97 QT Interval:  339 QTC Calculation: 415 R Axis:   8 Text Interpretation:  Sinus rhythm Early repolarization pattern Abnormal ekg Confirmed by Gerhard Munch  MD ([id number]) on 03/26/2017 7:11:22 PM       Radiology Dg Chest 2 View  Result Date: 03/26/2017 CLINICAL DATA:  Mid chest pain since yesterday.  No dyspnea. EXAM: CHEST  2 VIEW COMPARISON:  03/28/2014 FINDINGS: The heart size and mediastinal contours are  within normal limits. Both lungs are clear. The visualized skeletal structures are unremarkable. IMPRESSION: No active cardiopulmonary disease. Electronically Signed   By: Tollie Eth M.D.   On: 03/26/2017 20:05    Procedures Procedures (including critical care time)  Medications Ordered in ED Medications  aspirin chewable tablet 324 mg (324 mg Oral Not Given 03/26/17 1847)  ketorolac (TORADOL) 30 MG/ML injection 15 mg (15 mg Intravenous Given 03/26/17 1924)     Initial Impression / Assessment and Plan / ED Course  I have reviewed the triage vital  signs and the nursing notes.  Pertinent labs & imaging results that were available during my care of the patient were reviewed by me and considered in my medical decision making (see chart for details).  9:18 PM Patient awake and alert, in no distress though sitting upright, eating dinner. I discussed all findings, including reassuring labs, x-ray. With low suspicion for ACS, no evidence for PE, pneumonia, patient we discharged with course of anti-inflammatories, primary care follow-up.  Final Clinical Impressions(s) / ED Diagnoses   Final diagnoses:  Atypical chest pain    New Prescriptions New Prescriptions   IBUPROFEN (ADVIL,MOTRIN) 400 MG TABLET    Take 1 tablet (400 mg total) by mouth 3 (three) times daily. Take one tablet three times daily for three days     Gerhard Munch, MD 03/26/17 2118

## 2017-03-26 NOTE — Discharge Instructions (Signed)
As discussed, your evaluation today has been largely reassuring.  But, it is important that you monitor your condition carefully, and do not hesitate to return to the ED if you develop new, or concerning changes in your condition. ? ?Otherwise, please follow-up with your physician for appropriate ongoing care. ? ?

## 2017-04-09 ENCOUNTER — Emergency Department (HOSPITAL_COMMUNITY)
Admission: EM | Admit: 2017-04-09 | Discharge: 2017-04-09 | Disposition: A | Discharge status: Home / Self Care | Payer: Medicare Other | Attending: Emergency Medicine | Admitting: Emergency Medicine

## 2017-04-09 ENCOUNTER — Encounter (HOSPITAL_COMMUNITY): Payer: Self-pay | Admitting: Emergency Medicine

## 2017-04-09 ENCOUNTER — Emergency Department (HOSPITAL_COMMUNITY): Payer: Medicare Other

## 2017-04-09 ENCOUNTER — Other Ambulatory Visit: Payer: Self-pay

## 2017-04-09 DIAGNOSIS — J45909 Unspecified asthma, uncomplicated: Secondary | ICD-10-CM | POA: Insufficient documentation

## 2017-04-09 DIAGNOSIS — I10 Essential (primary) hypertension: Secondary | ICD-10-CM | POA: Insufficient documentation

## 2017-04-09 DIAGNOSIS — R0789 Other chest pain: Secondary | ICD-10-CM | POA: Insufficient documentation

## 2017-04-09 DIAGNOSIS — Z79899 Other long term (current) drug therapy: Secondary | ICD-10-CM | POA: Insufficient documentation

## 2017-04-09 DIAGNOSIS — R079 Chest pain, unspecified: Secondary | ICD-10-CM | POA: Diagnosis not present

## 2017-04-09 LAB — BASIC METABOLIC PANEL WITH GFR
Anion gap: 10 (ref 5–15)
BUN: 9 mg/dL (ref 6–20)
CO2: 22 mmol/L (ref 22–32)
Calcium: 9.5 mg/dL (ref 8.9–10.3)
Chloride: 106 mmol/L (ref 101–111)
Creatinine, Ser: 0.84 mg/dL (ref 0.61–1.24)
GFR calc Af Amer: >60 mL/min
GFR calc non Af Amer: >60 mL/min
Glucose, Bld: 96 mg/dL (ref 65–99)
Potassium: 3.6 mmol/L (ref 3.5–5.1)
Sodium: 138 mmol/L (ref 135–145)

## 2017-04-09 LAB — CBC
HCT: 37.2 % — ABNORMAL LOW (ref 39.0–52.0)
Hemoglobin: 12.3 g/dL — ABNORMAL LOW (ref 13.0–17.0)
MCH: 30.1 pg (ref 26.0–34.0)
MCHC: 33.1 g/dL (ref 30.0–36.0)
MCV: 91.2 fL (ref 78.0–100.0)
Platelets: 314 10*3/uL (ref 150–400)
RBC: 4.08 MIL/uL — ABNORMAL LOW (ref 4.22–5.81)
RDW: 11.8 % (ref 11.5–15.5)
WBC: 7.1 10*3/uL (ref 4.0–10.5)

## 2017-04-09 LAB — I-STAT TROPONIN, ED: TROPONIN I, POC: 0 ng/mL (ref 0.00–0.08)

## 2017-04-09 MED ORDER — OXYCODONE-ACETAMINOPHEN 5-325 MG PO TABS
1.0000 | ORAL_TABLET | Freq: Once | ORAL | Status: AC
  Administered 2017-04-09: 1 via ORAL
  Filled 2017-04-09: qty 1

## 2017-04-09 MED ORDER — KETOROLAC TROMETHAMINE 30 MG/ML IJ SOLN
60.0000 mg | Freq: Once | INTRAMUSCULAR | Status: AC
  Administered 2017-04-09: 60 mg via INTRAMUSCULAR
  Filled 2017-04-09: qty 2

## 2017-04-09 MED ORDER — IBUPROFEN 800 MG PO TABS: 800.0000 mg | ORAL_TABLET | Freq: Three times a day (TID) | ORAL | 0 refills | Status: DC | PRN

## 2017-04-09 MED ORDER — TRAMADOL HCL 50 MG PO TABS: 50.0000 mg | ORAL_TABLET | Freq: Four times a day (QID) | ORAL | 0 refills | Status: DC | PRN

## 2017-04-09 NOTE — ED Triage Notes (Signed)
Patient reluctantly ambulatory.

## 2017-04-09 NOTE — ED Triage Notes (Signed)
Patient arrives with recurrent chest pain. Substernal pain with radiation to right arm. History of similar pain. Today pain is worse.

## 2017-04-09 NOTE — Discharge Instructions (Signed)
Your testing here tonight did not show any significant abnormalities.  Your labs and x-rays were compared to previous visits and there is no significant changes

## 2017-04-09 NOTE — ED Provider Notes (Signed)
[facility] Provider Note   CSN: [id number] Arrival date & time: 04/09/17  0159     History   Chief Complaint Chief Complaint  Patient presents with  . Chest Pain    HPI Dylan Frank is a 31 y.o. male.  HPI Patient presents to the emergency department withPatient here with 2 weeks of constant chest pain that is not subsided.  The patient states that he was seen recently for similar symptoms.  Here in the emergency department.  He states that he has not improved since that time.  Patient states nothing seems make the condition better, movement and palpation make the pain worseThe patient denies  shortness of breath, headache,blurred vision, neck pain, fever, cough, weakness, numbness, dizziness, anorexia, edema, abdominal pain, nausea, vomiting, diarrhea, rash, back pain, dysuria, hematemesis, bloody stool, near syncope, or syncope.  Patient states he did not take any medications prior to arrival Past Medical History:  Diagnosis Date  . ADHD (attention deficit hyperactivity disorder)   . Asthma    prn inhaler  . Autism   . Depression   . Gynecomastia, male 05/2013  . History of cardiac murmur    as a child - states no problems; no murmur, per PCP note 10/22/2012  . Hypertension    under control with med., has been on med. x 3 mos.  . Seizures (HCC)   . Wears glasses     Patient Active Problem List   Diagnosis Date Noted  . Dysuria 08/03/2016  . High risk sexual behavior 08/03/2016  . Penile lesion 04/12/2016  . Urethritis 04/12/2016  . Positive hepatitis C antibody test 10/14/2015  . Viral illness 04/02/2014  . Gynecomastia, male 02/11/2013  . Conduct disorder, 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  . Metabolic syndrome 07/01/2012  . Medication management 07/01/2012  . HTN (hypertension) 07/01/2012  . ATTENTION DEFICIT, W/HYPERACTIVITY 02/13/2007  . ASTHMA, UNSPECIFIED 02/13/2007    Past Surgical History:  Procedure Laterality Date  . BREAST REDUCTION SURGERY  Bilateral 05/25/2013   Procedure: BILATERAL BREAST REDUCTION WITH LIPO ASSISTANCE left breast;  Surgeon: Louisa Second, MD;  Location: [facility];  Service: Plastics;  Laterality: Bilateral;  . CIRCUMCISION     age 1  . MEATOTOMY     age 1       Home Medications    Prior to Admission medications   Medication Sig Start Date End Date Taking? Authorizing Provider  divalproex (DEPAKOTE ER) 500 MG 24 hr tablet Take 500 mg by mouth every evening.    Yes Historical Provider, MD  fluticasone (FLONASE) 50 MCG/ACT nasal spray Place 1 spray into both nostrils daily. 12/23/13  Yes Nestor Ramp, MD  lisinopril (PRINIVIL,ZESTRIL) 10 MG tablet TAKE 1 TABLET BY MOUTH DAILY 01/28/17  Yes Nestor Ramp, MD  methylphenidate (CONCERTA) 18 MG CR tablet Take 18 mg by mouth every morning.   Yes Historical Provider, MD  QUEtiapine (SEROQUEL) 400 MG tablet Take 400 mg by mouth at bedtime.    Yes Historical Provider, MD  albuterol (PROVENTIL HFA;VENTOLIN HFA) 108 (90 BASE) MCG/ACT inhaler Inhale 2 puffs into the lungs every 6 (six) hours as needed for wheezing. Patient not taking: Reported on 04/09/2017 03/02/14   Nestor Ramp, MD  cephALEXin (KEFLEX) 500 MG capsule Take 1 capsule (500 mg total) by mouth 3 (three) times daily. Patient not taking: Reported on 04/09/2017 04/01/14   Nestor Ramp, MD  HYDROcodone-acetaminophen (HYCET) 7.5-325 mg/15 ml solution Take 10 mLs by mouth 4 (four)  times daily as needed for moderate pain or severe pain. Patient not taking: Reported on 04/09/2017 09/02/15   Trixie Dredge, PA-C    Family History Family History  Problem Relation Age of Onset  . Diabetes Mother   . Other Mother     visually impaired    Social History Social History  Substance Use Topics  . Smoking status: Never Smoker  . Smokeless tobacco: Never Used  . Alcohol use Yes     Allergies   Mushroom extract complex   Review of Systems Review of Systems  All other systems negative except as  documented in the HPI. All pertinent positives and negatives as reviewed in the HPI. Physical Exam Updated Vital Signs BP 126/79   Pulse 81   Temp 99 F (37.2 C) (Oral)   Resp 18   Ht  (1.778 m)   Wt 83.9 kg   SpO2 99%   BMI 26.54 kg/m   Physical Exam  Constitutional: He is oriented to person, place, and time. He appears well-developed and well-nourished. No distress.  HENT:  Head: Normocephalic and atraumatic.  Mouth/Throat: Oropharynx is clear and moist.  Eyes: Pupils are equal, round, and reactive to light.  Neck: Normal range of motion. Neck supple.  Cardiovascular: Normal rate, regular rhythm and normal heart sounds.  Exam reveals no gallop and no friction rub.   No murmur heard. Pulmonary/Chest: Effort normal and breath sounds normal. No respiratory distress. He has no wheezes. He exhibits tenderness.  Abdominal: Soft. Bowel sounds are normal. He exhibits no distension. There is no tenderness.  Neurological: He is alert and oriented to person, place, and time. He exhibits normal muscle tone. Coordination normal.  Skin: Skin is warm and dry. Capillary refill takes less than 2 seconds. No rash noted. No erythema.  Psychiatric: He has a normal mood and affect. His behavior is normal.  Nursing note and vitals reviewed.    ED Treatments / Results  Labs (all labs ordered are listed, but only abnormal results are displayed) Labs Reviewed  CBC - Abnormal; Notable for the following:       Result Value   RBC 4.08 (*)    Hemoglobin 12.3 (*)    HCT 37.2 (*)    All other components within normal limits  BASIC METABOLIC PANEL  I-STAT TROPOININ, ED    EKG  EKG Interpretation None       Radiology Dg Chest 2 View  Result Date: 04/09/2017 CLINICAL DATA:  Chest pain EXAM: CHEST  2 VIEW COMPARISON:  Chest radiograph 03/26/2017 FINDINGS: The heart size and mediastinal contours are within normal limits. Both lungs are clear. The visualized skeletal structures are  unremarkable. IMPRESSION: No active cardiopulmonary disease. Electronically Signed   By: Deatra Robinson M.D.   On: 04/09/2017 02:56    Procedures Procedures (including critical care time)  Medications Ordered in ED Medications  oxyCODONE-acetaminophen (PERCOCET/ROXICET) 5-325 MG per tablet 1 tablet (1 tablet Oral Given 04/09/17 0524)  ketorolac (TORADOL) 30 MG/ML injection 60 mg (60 mg Intramuscular Given 04/09/17 0523)     Initial Impression / Assessment and Plan / ED Course  I have reviewed the triage vital signs and the nursing notes.  Pertinent labs & imaging results that were available during my care of the patient were reviewed by me and considered in my medical decision making (see chart for details).     Patient will be treated for chest wall pain.  Told to follow-up with his primary care Dr. told  to return here as needed.  Patient agrees the plan and all questions were answered.  Patient was recently seen for similar symptoms and had negative workup at that time.  He also has a negative workup today  Final Clinical Impressions(s) / ED Diagnoses   Final diagnoses:  None    New Prescriptions New Prescriptions   No medications on file     Charlestine Night, PA-C 04/10/17 2130    Azalia Bilis, MD 04/10/17 [phone]

## 2017-06-13 ENCOUNTER — Emergency Department (HOSPITAL_COMMUNITY)
Admission: EM | Admit: 2017-06-13 | Discharge: 2017-06-13 | Disposition: A | Discharge status: Home / Self Care | Payer: Medicare Other | Attending: Emergency Medicine | Admitting: Emergency Medicine

## 2017-06-13 ENCOUNTER — Encounter (HOSPITAL_COMMUNITY): Payer: Self-pay | Admitting: Emergency Medicine

## 2017-06-13 DIAGNOSIS — M791 Myalgia: Secondary | ICD-10-CM | POA: Diagnosis not present

## 2017-06-13 DIAGNOSIS — Z5321 Procedure and treatment not carried out due to patient leaving prior to being seen by health care provider: Secondary | ICD-10-CM | POA: Insufficient documentation

## 2017-06-13 NOTE — ED Triage Notes (Signed)
C/o pain to R side of body from head to R lower leg x 2 days.  Denies nausea, vomiting, diarrhea, urinary complaints, or any other symptoms.  Denies injury.

## 2017-06-13 NOTE — ED Notes (Signed)
Pt did not answer when called for room x 2

## 2017-06-15 ENCOUNTER — Emergency Department (HOSPITAL_COMMUNITY)
Admission: EM | Admit: 2017-06-15 | Discharge: 2017-06-15 | Disposition: A | Discharge status: Home / Self Care | Payer: Medicare Other | Attending: Emergency Medicine | Admitting: Emergency Medicine

## 2017-06-15 ENCOUNTER — Encounter (HOSPITAL_COMMUNITY): Payer: Self-pay | Admitting: Emergency Medicine

## 2017-06-15 DIAGNOSIS — J45909 Unspecified asthma, uncomplicated: Secondary | ICD-10-CM | POA: Diagnosis not present

## 2017-06-15 DIAGNOSIS — I10 Essential (primary) hypertension: Secondary | ICD-10-CM | POA: Diagnosis not present

## 2017-06-15 DIAGNOSIS — M79601 Pain in right arm: Secondary | ICD-10-CM | POA: Diagnosis present

## 2017-06-15 DIAGNOSIS — Z79899 Other long term (current) drug therapy: Secondary | ICD-10-CM | POA: Insufficient documentation

## 2017-06-15 DIAGNOSIS — F840 Autistic disorder: Secondary | ICD-10-CM | POA: Diagnosis not present

## 2017-06-15 DIAGNOSIS — R590 Localized enlarged lymph nodes: Secondary | ICD-10-CM | POA: Diagnosis not present

## 2017-06-15 DIAGNOSIS — N50819 Testicular pain, unspecified: Secondary | ICD-10-CM

## 2017-06-15 DIAGNOSIS — R591 Generalized enlarged lymph nodes: Secondary | ICD-10-CM

## 2017-06-15 DIAGNOSIS — R52 Pain, unspecified: Secondary | ICD-10-CM

## 2017-06-15 DIAGNOSIS — M791 Myalgia: Secondary | ICD-10-CM | POA: Diagnosis not present

## 2017-06-15 LAB — BASIC METABOLIC PANEL
Anion gap: 7 (ref 5–15)
BUN: 6 mg/dL (ref 6–20)
CALCIUM: 9.1 mg/dL (ref 8.9–10.3)
CO2: 23 mmol/L (ref 22–32)
CREATININE: 0.86 mg/dL (ref 0.61–1.24)
Chloride: 106 mmol/L (ref 101–111)
Glucose, Bld: 104 mg/dL — ABNORMAL HIGH (ref 65–99)
Potassium: 3.5 mmol/L (ref 3.5–5.1)
SODIUM: 136 mmol/L (ref 135–145)

## 2017-06-15 LAB — URINALYSIS, ROUTINE W REFLEX MICROSCOPIC
BILIRUBIN URINE: NEGATIVE
Glucose, UA: NEGATIVE mg/dL
Hgb urine dipstick: NEGATIVE
KETONES UR: NEGATIVE mg/dL
Nitrite: NEGATIVE
Protein, ur: 30 mg/dL — AB
SQUAMOUS EPITHELIAL / LPF: NONE SEEN
Specific Gravity, Urine: 1.028 (ref 1.005–1.030)
pH: 5 (ref 5.0–8.0)

## 2017-06-15 LAB — CBC WITH DIFFERENTIAL/PLATELET
BASOS PCT: 1 %
Basophils Absolute: 0 10*3/uL (ref 0.0–0.1)
EOS ABS: 0 10*3/uL (ref 0.0–0.7)
Eosinophils Relative: 0 %
HCT: 39.2 % (ref 39.0–52.0)
HEMOGLOBIN: 12.7 g/dL — AB (ref 13.0–17.0)
Lymphocytes Relative: 18 %
Lymphs Abs: 1.6 10*3/uL (ref 0.7–4.0)
MCH: 29.5 pg (ref 26.0–34.0)
MCHC: 32.4 g/dL (ref 30.0–36.0)
MCV: 91 fL (ref 78.0–100.0)
Monocytes Absolute: 0.7 10*3/uL (ref 0.1–1.0)
Monocytes Relative: 8 %
NEUTROS PCT: 73 %
Neutro Abs: 6.5 10*3/uL (ref 1.7–7.7)
Platelets: 290 10*3/uL (ref 150–400)
RBC: 4.31 MIL/uL (ref 4.22–5.81)
RDW: 12.3 % (ref 11.5–15.5)
WBC: 8.8 10*3/uL (ref 4.0–10.5)

## 2017-06-15 LAB — RAPID HIV SCREEN (HIV 1/2 AB+AG)
HIV 1/2 ANTIBODIES: NONREACTIVE
HIV-1 P24 Antigen - HIV24: NONREACTIVE

## 2017-06-15 MED ORDER — AZITHROMYCIN 250 MG PO TABS
1000.0000 mg | ORAL_TABLET | Freq: Once | ORAL | Status: AC
  Administered 2017-06-15: 1000 mg via ORAL
  Filled 2017-06-15: qty 4

## 2017-06-15 MED ORDER — METRONIDAZOLE 500 MG PO TABS
2000.0000 mg | ORAL_TABLET | Freq: Once | ORAL | Status: AC
  Administered 2017-06-15: 2000 mg via ORAL
  Filled 2017-06-15: qty 4

## 2017-06-15 MED ORDER — ACETAMINOPHEN 500 MG PO TABS
1000.0000 mg | ORAL_TABLET | Freq: Once | ORAL | Status: AC
  Administered 2017-06-15: 1000 mg via ORAL
  Filled 2017-06-15: qty 2

## 2017-06-15 MED ORDER — IBUPROFEN 400 MG PO TABS
600.0000 mg | ORAL_TABLET | Freq: Once | ORAL | Status: AC
  Administered 2017-06-15: 600 mg via ORAL
  Filled 2017-06-15: qty 1

## 2017-06-15 MED ORDER — CEFTRIAXONE SODIUM 250 MG IJ SOLR
250.0000 mg | Freq: Once | INTRAMUSCULAR | Status: AC
  Administered 2017-06-15: 250 mg via INTRAMUSCULAR
  Filled 2017-06-15: qty 250

## 2017-06-15 MED ORDER — LIDOCAINE HCL (PF) 1 % IJ SOLN
INTRAMUSCULAR | Status: AC
  Administered 2017-06-15: 5 mL
  Filled 2017-06-15: qty 5

## 2017-06-15 NOTE — ED Provider Notes (Signed)
[facility]-EMERGENCY DEPT Provider Note   CSN: [id number] Arrival date & time: 06/15/17  1819     History   Chief Complaint Chief Complaint  Patient presents with  . Arm Pain    HPI Dylan Frank is a 31 y.o. male presents to the ED reporting gradually worsening, constant pain to the entire side of his right body including his right arms, right trunk, and entire right lower extremity excluding the right foot x one week. He also developed left sided trunk pain in the last hour while in the ED. Associated symptoms include palpable right neck lymph nodes and headache. No associated fever, recent URI illness or symptoms, sore throat, chest pain, shortness of breath, cough, abdominal pain, urinary symptoms, penile discharge, anal discharge or pain.  Upon further questioning, patient reports he noticed some bumps to his genital area 4 days. He also reports right testicular pain "a few days ago". He is sexually active with men only, he is here with his current male partner. He does not use condoms during intercourse. Partner does not have any constitutional symptoms, myalgias, rashes. Patient was last tested for HIV/syphilis one month ago and results were negative.  HPI  Past Medical History:  Diagnosis Date  . ADHD (attention deficit hyperactivity disorder)   . Asthma    prn inhaler  . Autism   . Depression   . Gynecomastia, male 05/2013  . History of cardiac murmur    as a child - states no problems; no murmur, per PCP note 10/22/2012  . Hypertension    under control with med., has been on med. x 3 mos.  . Seizures (HCC)   . Wears glasses     Patient Active Problem List   Diagnosis Date Noted  . Dysuria 08/03/2016  . High risk sexual behavior 08/03/2016  . Penile lesion 04/12/2016  . Urethritis 04/12/2016  . Positive hepatitis C antibody test 10/14/2015  . Viral illness 04/02/2014  . Gynecomastia, male 02/11/2013  . Conduct disorder, 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  . Metabolic  syndrome 07/01/2012  . Medication management 07/01/2012  . HTN (hypertension) 07/01/2012  . ATTENTION DEFICIT, W/HYPERACTIVITY 02/13/2007  . ASTHMA, UNSPECIFIED 02/13/2007    Past Surgical History:  Procedure Laterality Date  . BREAST REDUCTION SURGERY Bilateral 05/25/2013   Procedure: BILATERAL BREAST REDUCTION WITH LIPO ASSISTANCE left breast;  Surgeon: Louisa Second, MD;  Location: [facility];  Service: Plastics;  Laterality: Bilateral;  . CIRCUMCISION     age 39  . MEATOTOMY     age 39       Home Medications    Prior to Admission medications   Medication Sig Start Date End Date Taking? Authorizing Provider  albuterol (PROVENTIL HFA;VENTOLIN HFA) 108 (90 BASE) MCG/ACT inhaler Inhale 2 puffs into the lungs every 6 (six) hours as needed for wheezing. Patient not taking: Reported on 04/09/2017 03/02/14   Nestor Ramp, MD  cephALEXin (KEFLEX) 500 MG capsule Take 1 capsule (500 mg total) by mouth 3 (three) times daily. Patient not taking: Reported on 04/09/2017 04/01/14   Nestor Ramp, MD  divalproex (DEPAKOTE ER) 500 MG 24 hr tablet Take 500 mg by mouth every evening.     [provider]  fluticasone (FLONASE) 50 MCG/ACT nasal spray Place 1 spray into both nostrils daily. 12/23/13   Nestor Ramp, MD  HYDROcodone-acetaminophen (HYCET) 7.5-325 mg/15 ml solution Take 10 mLs by mouth 4 (four) times daily as needed for moderate pain or severe pain. Patient  not taking: Reported on 04/09/2017 09/02/15   Trixie DredgeWest, Emily, PA-C  ibuprofen (ADVIL,MOTRIN) 800 MG tablet Take 1 tablet (800 mg total) by mouth every 8 (eight) hours as needed. 04/09/17   Lawyer, Cristal Deerhristopher, PA-C  lisinopril (PRINIVIL,ZESTRIL) 10 MG tablet TAKE 1 TABLET BY MOUTH DAILY 01/28/17   Nestor RampNeal, Sara L, MD  methylphenidate (CONCERTA) 18 MG CR tablet Take 18 mg by mouth every morning.    [provider]  QUEtiapine (SEROQUEL) 400 MG tablet Take 400 mg by mouth at bedtime.     [provider]    traMADol (ULTRAM) 50 MG tablet Take 1 tablet (50 mg total) by mouth every 6 (six) hours as needed for severe pain. 04/09/17   Charlestine NightLawyer, Christopher, PA-C    Family History Family History  Problem Relation Age of Onset  . Diabetes Mother   . Other Mother        visually impaired    Social History Social History  Substance Use Topics  . Smoking status: Never Smoker  . Smokeless tobacco: Never Used  . Alcohol use Yes     Allergies   Mushroom extract complex   Review of Systems Review of Systems  Constitutional: Negative for chills, fever and unexpected weight change.  HENT: Negative for congestion and sore throat.   Respiratory: Negative for cough and shortness of breath.   Cardiovascular: Negative for chest pain.  Gastrointestinal: Negative for abdominal pain, constipation, diarrhea, nausea and vomiting.  Genitourinary: Positive for genital sores and testicular pain. Negative for difficulty urinating, discharge, dysuria, hematuria, penile pain, penile swelling and scrotal swelling.  Musculoskeletal: Positive for myalgias.  Skin: Positive for rash.  Allergic/Immunologic: Negative for immunocompromised state.  Neurological: Positive for headaches.     Physical Exam Updated Vital Signs BP 135/85 (BP Location: Right Arm)   Pulse 90   Temp 99.3 F (37.4 C) (Oral)   Resp 16   Ht 5\' 9"  (1.753 m)   Wt 83.9 kg (185 lb)   SpO2 97%   BMI 27.32 kg/m   Physical Exam  Constitutional: He is oriented to person, place, and time. He appears well-developed and well-nourished. No distress.  NAD.  HENT:  Head: Normocephalic and atraumatic.  Right Ear: External ear normal.  Left Ear: External ear normal.  Nose: Nose normal.  Oropharynx and tonsils normal without exudates  Eyes: Conjunctivae and EOM are normal. Pupils are equal, round, and reactive to light. No scleral icterus.  Neck: Normal range of motion. Neck supple.  Cardiovascular: Normal rate, regular rhythm, normal heart  sounds and intact distal pulses.   No murmur heard. Pulmonary/Chest: Effort normal and breath sounds normal. He has no wheezes.  Genitourinary: Right testis shows tenderness. Left testis shows tenderness.  Genitourinary Comments:  2-3 circular dry patches to scrotum without erythema, edema, fluctuance or warmth Bilateral groin lymphadenopathy, non tender without skin changes Bilateral testicular, epididymis tenderness without palpable masses Circumcised male.  No meatus discharge.  Glans and shaft smooth without tenderness, lesions, masses or deformity.  Cremasteric reflex intact.  Musculoskeletal: Normal range of motion. He exhibits no deformity.  Palpation to reported areas of pain does not exacerbate pain  Full PROM of UE and LE, ROM does not exacerbate pain  Lymphadenopathy:    He has cervical adenopathy. Inguinal adenopathy noted on the right and left side.  Right postauricular, preparotid, tonsillar and post cervical lymphadenopathy (non tender)   Neurological: He is alert and oriented to person, place, and time.  Skin: Skin is warm and  dry. Capillary refill takes less than 2 seconds.  No rash noted to trunk, UE, LE, palms or soles  Psychiatric: He has a normal mood and affect. His behavior is normal. Judgment and thought content normal.  Nursing note and vitals reviewed.    ED Treatments / Results  Labs (all labs ordered are listed, but only abnormal results are displayed) Labs Reviewed  URINALYSIS, ROUTINE W REFLEX MICROSCOPIC - Abnormal; Notable for the following:       Result Value   Color, Urine AMBER (*)    APPearance HAZY (*)    Protein, ur 30 (*)    Leukocytes, UA TRACE (*)    Bacteria, UA RARE (*)    All other components within normal limits  BASIC METABOLIC PANEL - Abnormal; Notable for the following:    Glucose, Bld 104 (*)    All other components within normal limits  CBC WITH DIFFERENTIAL/PLATELET - Abnormal; Notable for the following:    Hemoglobin 12.7  (*)    All other components within normal limits  WET PREP, GENITAL  URINE CULTURE  RAPID HIV SCREEN (HIV 1/2 AB+AG)  RPR  GC/CHLAMYDIA PROBE AMP ([place]) NOT AT [employer]    EKG  EKG Interpretation None       Radiology No results found.  Procedures Procedures (including critical care time)  Medications Ordered in ED Medications  azithromycin (ZITHROMAX) tablet 1,000 mg (not administered)  cefTRIAXone (ROCEPHIN) injection 250 mg (not administered)  metroNIDAZOLE (FLAGYL) tablet 2,000 mg (not administered)  acetaminophen (TYLENOL) tablet 1,000 mg (1,000 mg Oral Given 06/15/17 2016)  ibuprofen (ADVIL,MOTRIN) tablet 600 mg (600 mg Oral Given 06/15/17 2015)     Initial Impression / Assessment and Plan / ED Course  I have reviewed the triage vital signs and the nursing notes.  Pertinent labs & imaging results that were available during my care of the patient were reviewed by me and considered in my medical decision making (see chart for details).    31 yo male presents to ED with generalized pain to right and left side of body x 1 week associated with cervical and groin lymphadenopathy, testicular pain and rash to scrotum on exam.  In regards to rash, he has 2-3 non tender, circular, dry, slightly erythematous patches to scrotum.  Does not look like chancre or chancroid or herpes.  Considering tinea. Patient has sex with men, no new recent partners and no condom use. He is here with his male partner who denies similar symptoms. Given symptoms, STD panel ordered. CBC and BMP pending. Considering HIV vs malignancy?.  Last HIV/RPR testing one month ago negative. No preceding URI symptoms or illness. No exposure to ticks/woods.  U/A not suggestive of UTI.  CBC and Bmp otherwise reassuring.  Pending HIV, syphilis, GC/C, wet prep.    Final Clinical Impressions(s) / ED Diagnoses   Final diagnoses:  Lymphadenopathy  Body aches  Testicular pain   Patient treated empirically for  GC/C/trich given high risk sexual behavior and testicular pain.  He was advised to establish care with ID and contact his PCP within 2 days for further discussion of his symptoms and monitoring.  He is agreeable with plan. Strict ED return precautions given.   Patient, ED treatment and discharge plan was discussed with supervising physician who is agreeable with plan.   New Prescriptions New Prescriptions   No medications on file     Jerrell Mylar 06/15/17 2116    Melene Plan, DO 06/15/17 2307

## 2017-06-15 NOTE — Discharge Instructions (Signed)
We tested you for STD today including gonorrhea, chlamydia, trichomoniasis, HIV and syphilis given your symptoms. These results are not back yet. We have treated you for gonorrhea, chlamydia and trichomoniasis already. You will be notified of the test results via phone call within 2-3 days only if they are positive.   Your basic blood work was normal. You do not have a urinary tract infection.   Given your sexual practices (no condom use) you are at risk for sexually transmitted infection.  Please read attached information on how to practice safe sex.    It is still unclear on what is causing your generalized body pain and swollen lymph nodes. You can take ibuprofen or Tylenol for generalized body aches. Monitor the swelling of her lymph nodes. I would like for you to see an infectious disease specialist as soon as possible. He will reevaluate you and likely perform more testing. Call infectious disease doctor as soon as possible and make an appointment for an evaluation as soon as possible.  Contact your primary care provider as well, she may be able to see you and reevaluate you quicker than the  infectious disease provider. She can also order more tests and monitor your symptoms.

## 2017-06-15 NOTE — ED Triage Notes (Signed)
Pt states endorses right sided arm and leg pain, denies injury, ambulatory to triage room.

## 2017-06-15 NOTE — ED Notes (Signed)
Pt verbalized understanding discharge instructions and denies any further needs or questions at this time. VS stable, ambulatory and steady gait.   

## 2017-06-15 NOTE — ED Notes (Signed)
Nurse currently drawing labs 

## 2017-06-17 LAB — GC/CHLAMYDIA PROBE AMP (~~LOC~~) NOT AT ARMC
CHLAMYDIA, DNA PROBE: NEGATIVE
NEISSERIA GONORRHEA: POSITIVE — AB

## 2017-06-17 LAB — URINE CULTURE

## 2017-06-18 LAB — RPR, QUANT+TP ABS (REFLEX)
Rapid Plasma Reagin, Quant: 1:32 {titer} — ABNORMAL HIGH
T Pallidum Abs: POSITIVE — AB

## 2017-06-18 LAB — RPR: RPR Ser Ql: REACTIVE — AB

## 2017-07-08 DIAGNOSIS — Z0189 Encounter for other specified special examinations: Secondary | ICD-10-CM | POA: Diagnosis not present

## 2017-08-04 ENCOUNTER — Other Ambulatory Visit: Payer: Self-pay | Admitting: Family Medicine

## 2017-08-10 ENCOUNTER — Other Ambulatory Visit: Payer: Self-pay | Admitting: Family Medicine

## 2018-03-22 IMAGING — DX DG CHEST 2V
2 series · 2 of 2 positions shown · non-contrast
Comparison: Chest radiograph 03/26/2017

CLINICAL DATA: Chest pain

EXAM:
CHEST  2 VIEW

[w chest pa]
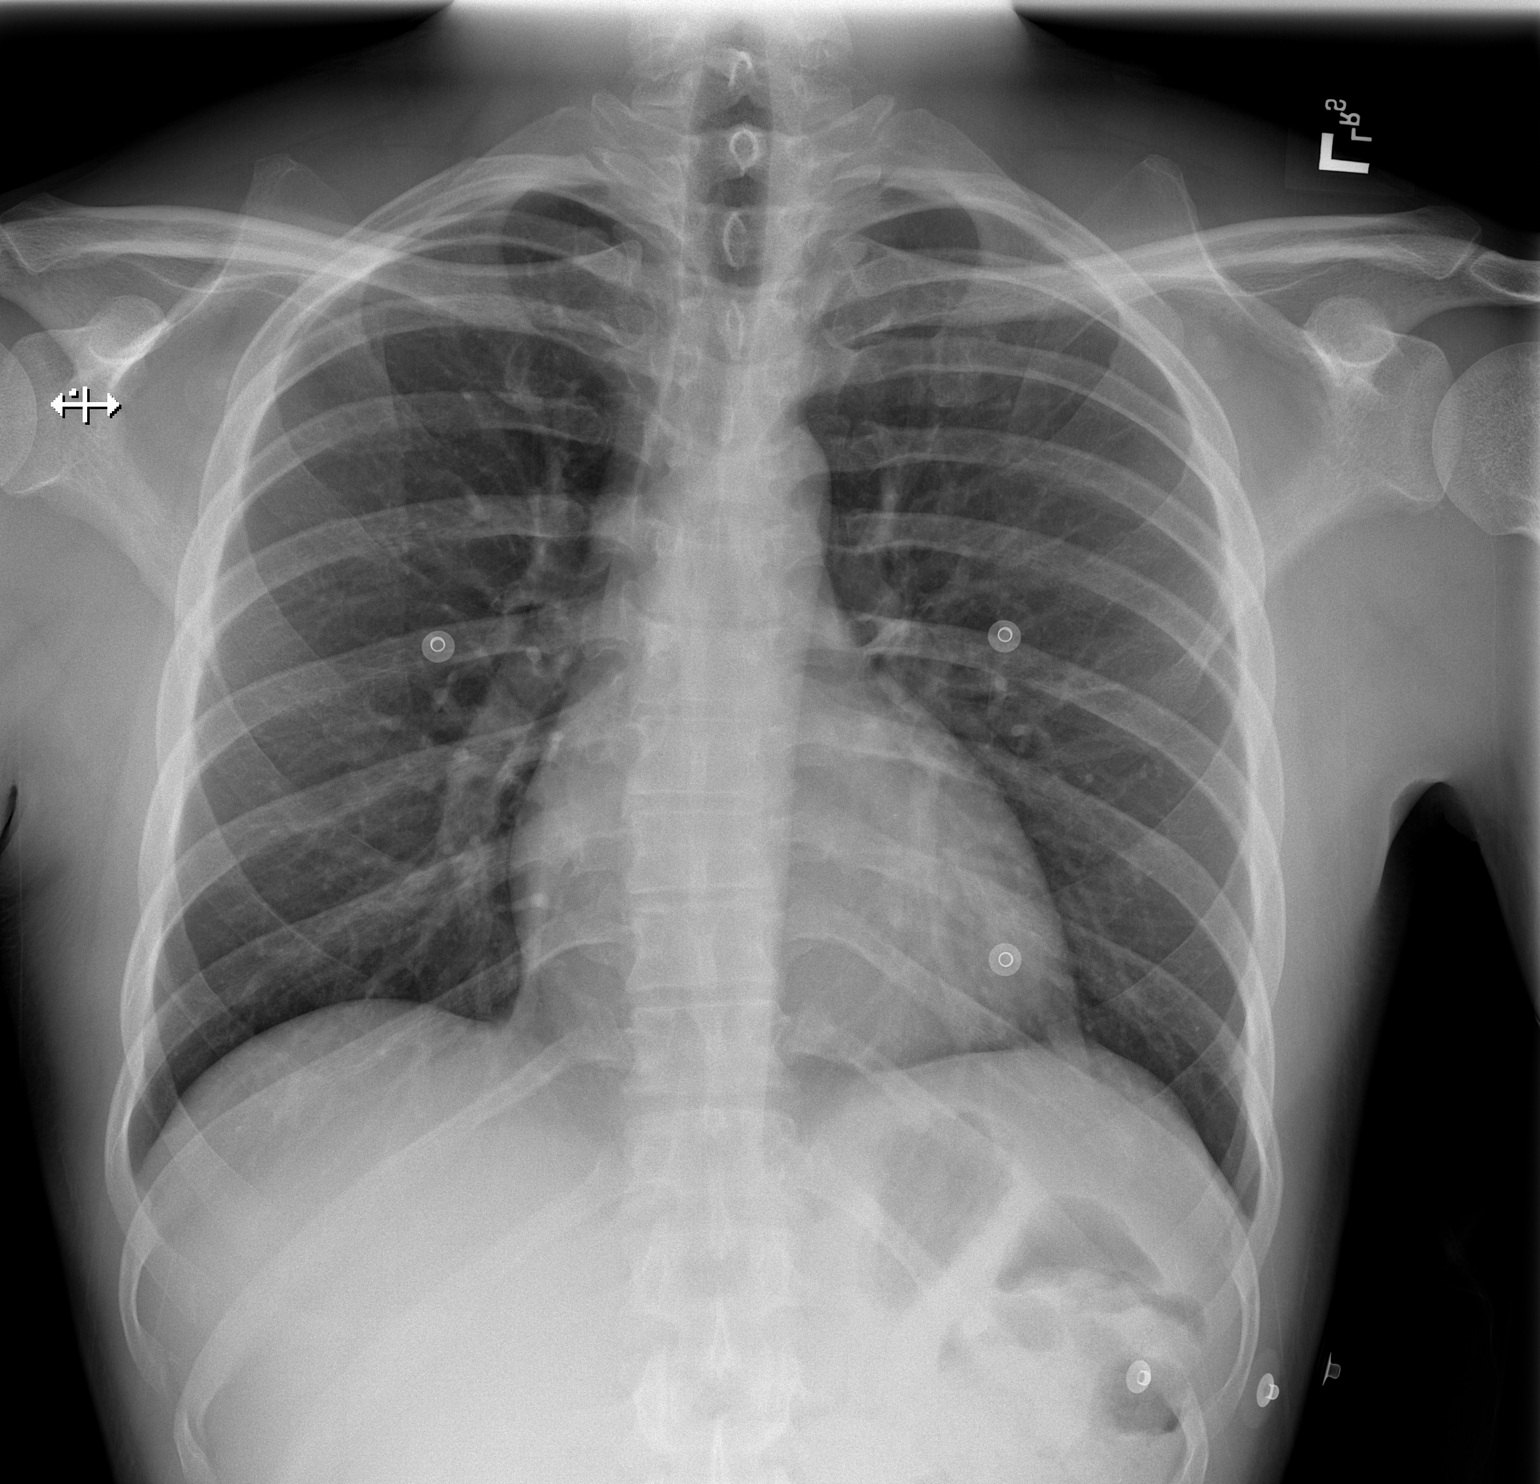

[w chest lat]
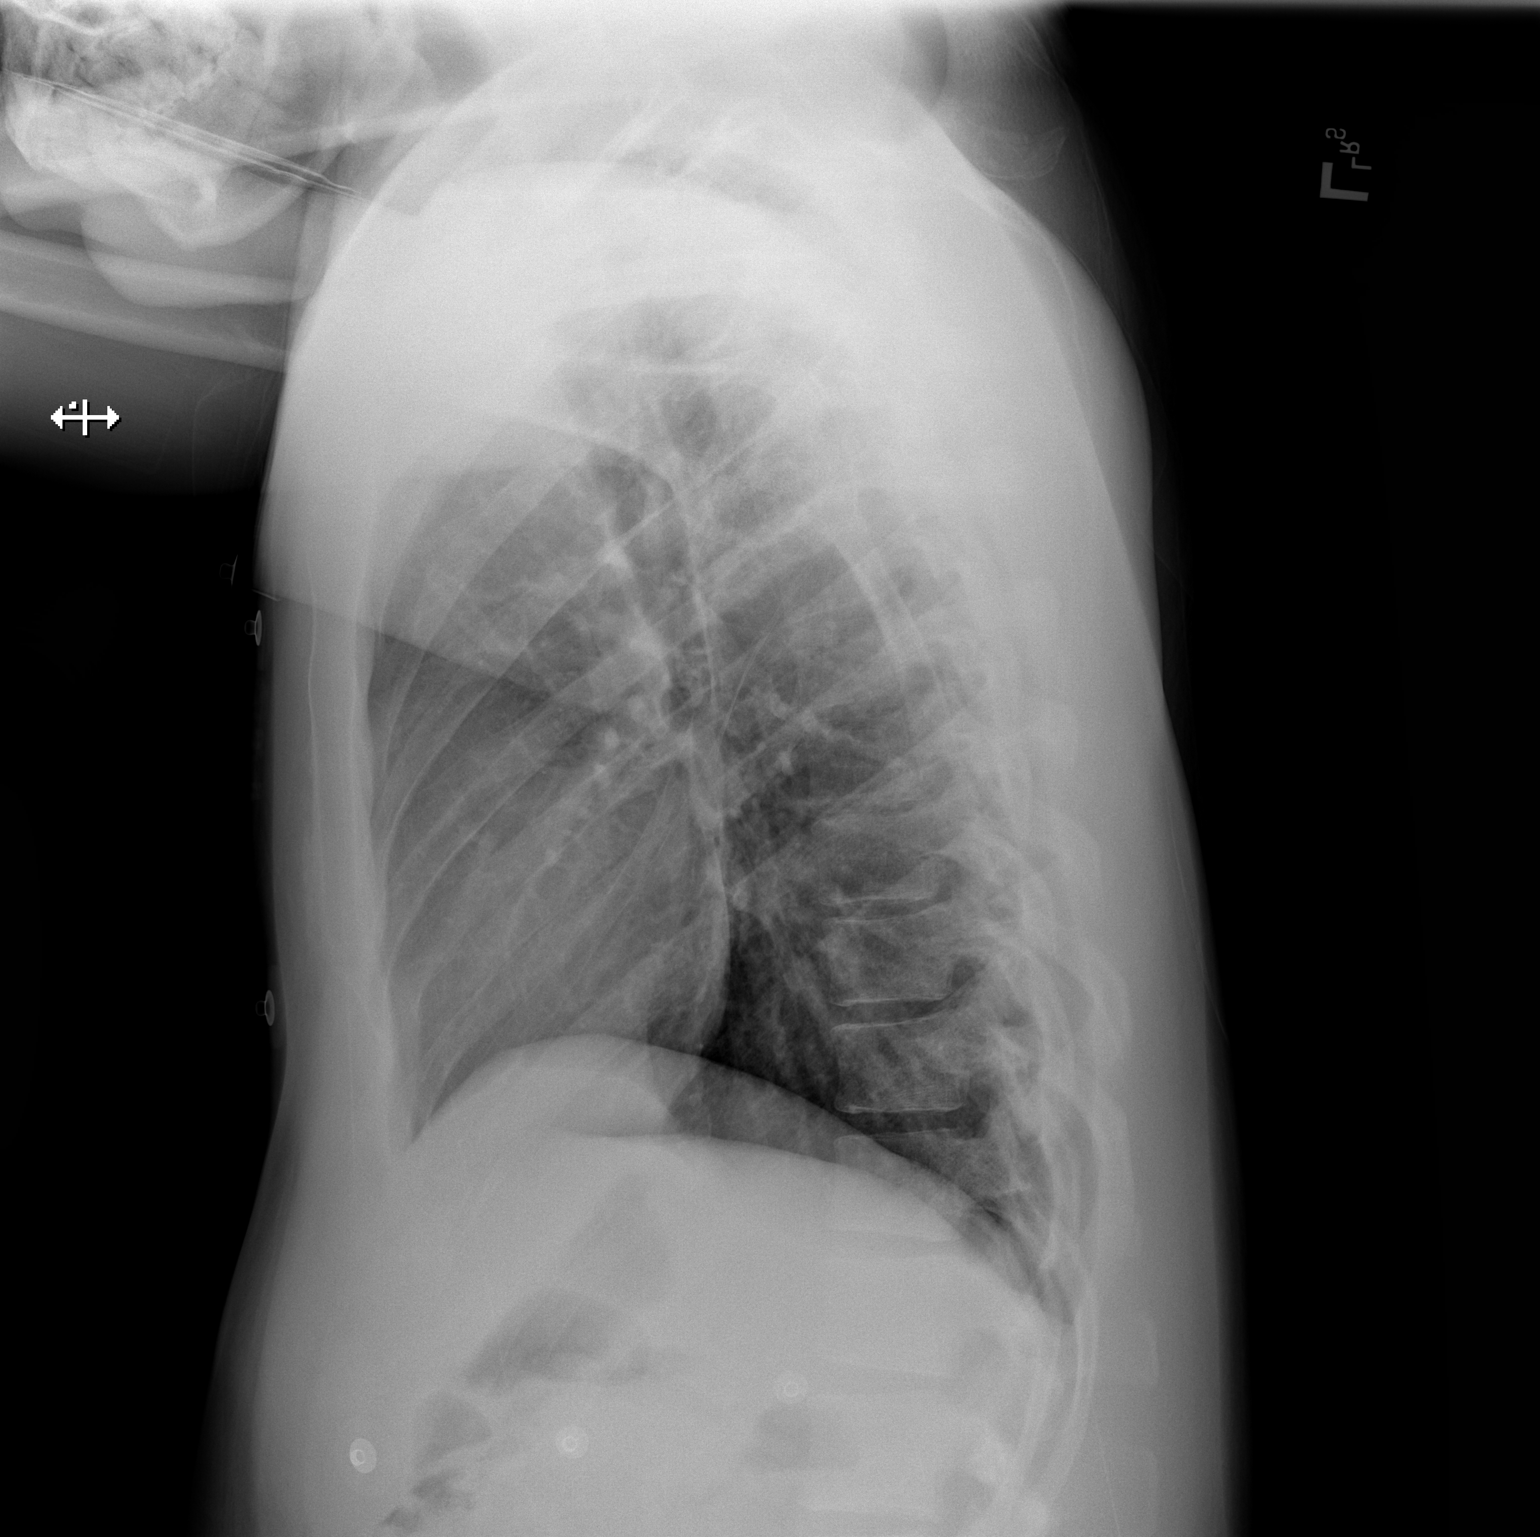

[2 of 2 positions shown; findings below may reference images not displayed]

FINDINGS: The heart size and mediastinal contours are within normal limits.
Both lungs are clear. The visualized skeletal structures are
unremarkable.
IMPRESSION: No active cardiopulmonary disease.

## 2018-04-20 ENCOUNTER — Other Ambulatory Visit: Payer: Self-pay | Admitting: Family Medicine

## 2018-06-26 DIAGNOSIS — N341 Nonspecific urethritis: Secondary | ICD-10-CM | POA: Diagnosis not present

## 2018-07-28 ENCOUNTER — Other Ambulatory Visit: Payer: Self-pay | Admitting: Family Medicine

## 2018-08-04 ENCOUNTER — Encounter (HOSPITAL_COMMUNITY): Payer: Self-pay | Admitting: Emergency Medicine

## 2018-08-04 ENCOUNTER — Other Ambulatory Visit: Payer: Self-pay

## 2018-08-04 ENCOUNTER — Emergency Department (HOSPITAL_COMMUNITY)
Admission: EM | Admit: 2018-08-04 | Discharge: 2018-08-05 | Disposition: A | Discharge status: Home / Self Care | Payer: Medicare Other | Attending: Emergency Medicine | Admitting: Emergency Medicine

## 2018-08-04 DIAGNOSIS — R55 Syncope and collapse: Secondary | ICD-10-CM | POA: Diagnosis not present

## 2018-08-04 DIAGNOSIS — I10 Essential (primary) hypertension: Secondary | ICD-10-CM | POA: Diagnosis not present

## 2018-08-04 DIAGNOSIS — Z79899 Other long term (current) drug therapy: Secondary | ICD-10-CM | POA: Insufficient documentation

## 2018-08-04 DIAGNOSIS — J45909 Unspecified asthma, uncomplicated: Secondary | ICD-10-CM | POA: Insufficient documentation

## 2018-08-04 DIAGNOSIS — R000 Tachycardia, unspecified: Secondary | ICD-10-CM | POA: Diagnosis not present

## 2018-08-04 LAB — CBC
HCT: 45.8 % (ref 39.0–52.0)
Hemoglobin: 14.8 g/dL (ref 13.0–17.0)
MCH: 30.8 pg (ref 26.0–34.0)
MCHC: 32.3 g/dL (ref 30.0–36.0)
MCV: 95.2 fL (ref 78.0–100.0)
Platelets: 228 10*3/uL (ref 150–400)
RBC: 4.81 MIL/uL (ref 4.22–5.81)
RDW: 11.2 % — AB (ref 11.5–15.5)
WBC: 9.2 10*3/uL (ref 4.0–10.5)

## 2018-08-04 LAB — BASIC METABOLIC PANEL
Anion gap: 7 (ref 5–15)
BUN: 9 mg/dL (ref 6–20)
CHLORIDE: 106 mmol/L (ref 98–111)
CO2: 26 mmol/L (ref 22–32)
CREATININE: 1.09 mg/dL (ref 0.61–1.24)
Calcium: 9.3 mg/dL (ref 8.9–10.3)
GFR calc Af Amer: >60 mL/min (ref >60)
GFR calc non Af Amer: >60 mL/min (ref >60)
Glucose, Bld: 130 mg/dL — ABNORMAL HIGH (ref 70–99)
Potassium: 3.6 mmol/L (ref 3.5–5.1)
Sodium: 139 mmol/L (ref 135–145)

## 2018-08-04 LAB — CBG MONITORING, ED: Glucose-Capillary: 126 mg/dL — ABNORMAL HIGH (ref 70–99)

## 2018-08-04 NOTE — ED Triage Notes (Signed)
Pt reports he had one episode of syncope prior to arrival. Pt denies dizziness. Pt reports his legs got weak and he passed out. Pt denies any recent illness, N/V. Pt reports pain from abdomen to ankles.

## 2018-08-05 LAB — URINALYSIS, ROUTINE W REFLEX MICROSCOPIC
Bilirubin Urine: NEGATIVE
Glucose, UA: NEGATIVE mg/dL
Hgb urine dipstick: NEGATIVE
Ketones, ur: NEGATIVE mg/dL
LEUKOCYTES UA: NEGATIVE
Nitrite: NEGATIVE
PROTEIN: NEGATIVE mg/dL
Specific Gravity, Urine: 1.011 (ref 1.005–1.030)
pH: 8 (ref 5.0–8.0)

## 2018-08-05 LAB — VALPROIC ACID LEVEL

## 2018-08-05 MED ORDER — SODIUM CHLORIDE 0.9 % IV BOLUS
1000.0000 mL | Freq: Once | INTRAVENOUS | Status: AC
  Administered 2018-08-05: 1000 mL via INTRAVENOUS

## 2018-08-05 NOTE — Discharge Instructions (Addendum)
Try to start drinking water.  This is important for staying hydrated. Would recommend to stop tramadol-- it is not good to take this if you have a seizure history. Follow-up with your primary care doctor. Return here for any new/acute changes.

## 2018-08-05 NOTE — ED Provider Notes (Signed)
[facility] EMERGENCY DEPARTMENT Provider Note   CSN: [id number] Arrival date & time: 08/04/18  2246     History   Chief Complaint Chief Complaint  Patient presents with  . Loss of Consciousness    HPI Dylan Frank is a 32 y.o. male.  The history is provided by the patient and medical records.    32 year old male history of ADHD, asthma, autism, depression, hypertension, seizure disorder, presenting to the ED with near syncopal event.  Patient states lately he has been feeling a little weak overall, worse when up and moving around.  States today he stood up and started to feel lightheaded.  He states he said " I need to go to the hospital" and then almost passed out, was caught before he hit the floor.  There was no head injury or other trauma.  Never fully lot consciousness.  Denies chest pain or SOB.  No recent fevers or other illness.  States he only ate once today and he never drinks water-- only soda.  States he does not like the taste of water.  No changes in medications.  No recent seizures.  He is requesting to eat/drink here.  Past Medical History:  Diagnosis Date  . ADHD (attention deficit hyperactivity disorder)   . Asthma    prn inhaler  . Autism   . Depression   . Gynecomastia, male 05/2013  . History of cardiac murmur    as a child - states no problems; no murmur, per PCP note 10/22/2012  . Hypertension    under control with med., has been on med. x 3 mos.  . Seizures (HCC)   . Wears glasses     Patient Active Problem List   Diagnosis Date Noted  . Dysuria 08/03/2016  . High risk sexual behavior 08/03/2016  . Penile lesion 04/12/2016  . Urethritis 04/12/2016  . Positive hepatitis C antibody test 10/14/2015  . Viral illness 04/02/2014  . Gynecomastia, male 02/11/2013  . Conduct disorder, 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  . Metabolic syndrome 07/01/2012  . Medication management 07/01/2012  . HTN (hypertension) 07/01/2012  . ATTENTION  DEFICIT, W/HYPERACTIVITY 02/13/2007  . ASTHMA, UNSPECIFIED 02/13/2007    Past Surgical History:  Procedure Laterality Date  . BREAST REDUCTION SURGERY Bilateral 05/25/2013   Procedure: BILATERAL BREAST REDUCTION WITH LIPO ASSISTANCE left breast;  Surgeon: Louisa Second, MD;  Location: [facility];  Service: Plastics;  Laterality: Bilateral;  . CIRCUMCISION     age 89  . MEATOTOMY     age 89        Home Medications    Prior to Admission medications   Medication Sig Start Date End Date Taking? Authorizing Provider  albuterol (PROVENTIL HFA;VENTOLIN HFA) 108 (90 BASE) MCG/ACT inhaler Inhale 2 puffs into the lungs every 6 (six) hours as needed for wheezing. Patient not taking: Reported on 04/09/2017 03/02/14   Nestor Ramp, MD  cephALEXin (KEFLEX) 500 MG capsule Take 1 capsule (500 mg total) by mouth 3 (three) times daily. Patient not taking: Reported on 04/09/2017 04/01/14   Nestor Ramp, MD  divalproex (DEPAKOTE ER) 500 MG 24 hr tablet Take 500 mg by mouth every evening.     [provider]  fluticasone (FLONASE) 50 MCG/ACT nasal spray Place 1 spray into both nostrils daily. 12/23/13   Nestor Ramp, MD  HYDROcodone-acetaminophen (HYCET) 7.5-325 mg/15 ml solution Take 10 mLs by mouth 4 (four) times daily as needed for moderate pain or severe  pain. Patient not taking: Reported on 04/09/2017 09/02/15   Trixie DredgeWest, Emily, PA-C  ibuprofen (ADVIL,MOTRIN) 800 MG tablet Take 1 tablet (800 mg total) by mouth every 8 (eight) hours as needed. 04/09/17   Lawyer, Cristal Deerhristopher, PA-C  lisinopril (PRINIVIL,ZESTRIL) 10 MG tablet TAKE 1 TABLET BY MOUTH DAILY 08/05/17   Nestor RampNeal, Sara L, MD  lisinopril (PRINIVIL,ZESTRIL) 10 MG tablet TAKE 1 TABLET BY MOUTH DAILY 07/29/18   Nestor RampNeal, Sara L, MD  methylphenidate (CONCERTA) 18 MG CR tablet Take 18 mg by mouth every morning.    [provider]  QUEtiapine (SEROQUEL) 400 MG tablet Take 400 mg by mouth at bedtime.     [provider]  traMADol  (ULTRAM) 50 MG tablet Take 1 tablet (50 mg total) by mouth every 6 (six) hours as needed for severe pain. 04/09/17   Charlestine NightLawyer, Christopher, PA-C    Family History Family History  Problem Relation Age of Onset  . Diabetes Mother   . Other Mother        visually impaired    Social History Social History   Tobacco Use  . Smoking status: Never Smoker  . Smokeless tobacco: Never Used  Substance Use Topics  . Alcohol use: Yes  . Drug use: Yes    Types: Marijuana     Allergies   Mushroom extract complex   Review of Systems Review of Systems  Neurological: Positive for syncope (near syncope).  All other systems reviewed and are negative.    Physical Exam Updated Vital Signs BP (!) 155/99 (BP Location: Left Arm)   Pulse (!) 116   Temp 98.9 F (37.2 C) (Oral)   Resp 18   Ht 5\' 9"  (1.753 m)   Wt 72.6 kg   SpO2 99%   BMI 23.63 kg/m   Physical Exam  Constitutional: He is oriented to person, place, and time. He appears well-developed and well-nourished.  HENT:  Head: Normocephalic and atraumatic.  Mouth/Throat: Oropharynx is clear and moist.  Dry mucous membranes No visible signs of head trauma  Eyes: Pupils are equal, round, and reactive to light. Conjunctivae and EOM are normal.  Neck: Normal range of motion.  Cardiovascular: Normal rate, regular rhythm and normal heart sounds.  Pulmonary/Chest: Effort normal and breath sounds normal. No stridor. No respiratory distress.  Abdominal: Soft. Bowel sounds are normal. There is no tenderness. There is no rebound.  Musculoskeletal: Normal range of motion.  Neurological: He is alert and oriented to person, place, and time.  AAOx3, answering questions and following commands appropriately; equal strength UE and LE bilaterally; CN grossly intact; moves all extremities appropriately without ataxia; no focal neuro deficits or facial asymmetry appreciated  Skin: Skin is warm and dry.  Psychiatric: He has a normal mood and affect.    Odd affect  Nursing note and vitals reviewed.    ED Treatments / Results  Labs (all labs ordered are listed, but only abnormal results are displayed) Labs Reviewed  BASIC METABOLIC PANEL - Abnormal; Notable for the following components:      Result Value   Glucose, Bld 130 (*)    All other components within normal limits  CBC - Abnormal; Notable for the following components:   RDW 11.2 (*)    All other components within normal limits  CBG MONITORING, ED - Abnormal; Notable for the following components:   Glucose-Capillary 126 (*)    All other components within normal limits  URINALYSIS, ROUTINE W REFLEX MICROSCOPIC  VALPROIC ACID LEVEL  EKG None  Radiology No results found.  Procedures Procedures (including critical care time)  Medications Ordered in ED Medications  sodium chloride 0.9 % bolus 1,000 mL (has no administration in time range)     Initial Impression / Assessment and Plan / ED Course  I have reviewed the triage vital signs and the nursing notes.  Pertinent labs & imaging results that were available during my care of the patient were reviewed by me and considered in my medical decision making (see chart for details).  32 year old male here with near syncopal event.  He was caught prior to hitting the ground, no head trauma or other direct trauma.  No full loss of consciousness.  He has been sleeping throughout most of his ED stay here.  When awoken, he is awake, alert, appropriately oriented.  He has no focal neurologic deficits.  Reports feeling poorly for the past several days.  Admits he is only eaten one time today he does not drink any water at all, only soda.  He is requesting to eat/drink here during my initial evaluation. Labs are overall reassuring.   EKG without acute ischemic changes.  No chest pain or SOB. Orthostatics are appropriate.  Given bolus of IV fluids.  Does have history of seizure disorder, currently taking Depakote.  Level was checked  and is somewhat low.  He denies any recent breakthrough seizures.  Feeling better after meal and IVF here.  Feel he is stable for discharge.  Encouraged to increased PO intake, especially fluids.  Will have him follow-up closely with PCP.  Discussed plan with patient, he acknowledged understanding and agreed with plan of care.  Return precautions given for new or worsening symptoms.  Final Clinical Impressions(s) / ED Diagnoses   Final diagnoses:  Near syncope    ED Discharge Orders    None       Garlon HatchetSanders, Keilan Nichol M, PA-C 08/05/18 0523    Gilda CreasePollina, Christopher J, MD 08/05/18 [phone]

## 2018-08-05 NOTE — ED Notes (Signed)
Patient states that he "fell back" and someone "caught him"; denies actually passing out.

## 2018-08-05 NOTE — ED Notes (Signed)
Patient states "my balls hurt after having to sit on them in the lobby, I am trying to get comfortable".

## 2018-09-02 DIAGNOSIS — N341 Nonspecific urethritis: Secondary | ICD-10-CM | POA: Diagnosis not present

## 2018-09-15 ENCOUNTER — Other Ambulatory Visit: Payer: Self-pay

## 2018-09-15 NOTE — Patient Outreach (Signed)
[facility]) Care Management  09/15/2018  Dylan Frank [birth year] [id number]   Medication Adherence call to Dylan Frank left a message for patient to call back patient is due on Lisinopril 10 mg. Dylan Frank is showing past due under [employer].   Lillia Abed CPhT Pharmacy Technician [facility] Management Direct Dial [phone]  Fax [phone] [name]

## 2018-12-05 DIAGNOSIS — N341 Nonspecific urethritis: Secondary | ICD-10-CM | POA: Diagnosis not present

## 2018-12-24 ENCOUNTER — Encounter: Payer: Medicare Other | Admitting: Family Medicine

## 2019-01-08 DIAGNOSIS — N341 Nonspecific urethritis: Secondary | ICD-10-CM | POA: Diagnosis not present

## 2019-01-09 DIAGNOSIS — N341 Nonspecific urethritis: Secondary | ICD-10-CM | POA: Diagnosis not present

## 2019-09-07 DIAGNOSIS — H5213 Myopia, bilateral: Secondary | ICD-10-CM | POA: Diagnosis not present

## 2019-09-10 DIAGNOSIS — H5213 Myopia, bilateral: Secondary | ICD-10-CM | POA: Diagnosis not present

## 2019-11-05 DIAGNOSIS — H5203 Hypermetropia, bilateral: Secondary | ICD-10-CM | POA: Diagnosis not present

## 2019-11-25 DIAGNOSIS — N341 Nonspecific urethritis: Secondary | ICD-10-CM | POA: Diagnosis not present

## 2020-04-07 ENCOUNTER — Ambulatory Visit: Payer: Medicare Other | Attending: Family

## 2020-04-07 DIAGNOSIS — Z23 Encounter for immunization: Secondary | ICD-10-CM

## 2020-04-07 NOTE — Progress Notes (Signed)
Covid-19 Vaccination Clinic  Name:  Dylan Frank    MRN: [id number] DOB: [birth year]  04/07/2020  Mr. Dylan Frank was observed post Covid-19 immunization for 15 minutes without incident. He was provided with Vaccine Information Sheet and instruction to access the V-Safe system.   Mr. Dylan Frank was instructed to call 911 with any severe reactions post vaccine: Marland Kitchen Difficulty breathing  . Swelling of face and throat  . A fast heartbeat  . A bad rash all over body  . Dizziness and weakness   Immunizations Administered    Name Date Dose VIS Date Route   Moderna COVID-19 Vaccine 04/07/2020  3:43 PM 0.5 mL 11/2019 Intramuscular   Manufacturer: Moderna   Lot: [id number]   NDC: 14782-956-21

## 2020-04-27 ENCOUNTER — Encounter (HOSPITAL_COMMUNITY): Payer: Self-pay | Admitting: Emergency Medicine

## 2020-04-27 ENCOUNTER — Other Ambulatory Visit: Payer: Self-pay

## 2020-04-27 DIAGNOSIS — R29810 Facial weakness: Secondary | ICD-10-CM | POA: Diagnosis not present

## 2020-04-27 DIAGNOSIS — Z79899 Other long term (current) drug therapy: Secondary | ICD-10-CM | POA: Diagnosis not present

## 2020-04-27 DIAGNOSIS — H53149 Visual discomfort, unspecified: Secondary | ICD-10-CM | POA: Diagnosis not present

## 2020-04-27 DIAGNOSIS — F840 Autistic disorder: Secondary | ICD-10-CM | POA: Diagnosis not present

## 2020-04-27 DIAGNOSIS — G4489 Other headache syndrome: Secondary | ICD-10-CM | POA: Diagnosis not present

## 2020-04-27 DIAGNOSIS — Z743 Need for continuous supervision: Secondary | ICD-10-CM | POA: Diagnosis not present

## 2020-04-27 DIAGNOSIS — R000 Tachycardia, unspecified: Secondary | ICD-10-CM | POA: Diagnosis not present

## 2020-04-27 DIAGNOSIS — I10 Essential (primary) hypertension: Secondary | ICD-10-CM | POA: Diagnosis not present

## 2020-04-27 DIAGNOSIS — F1210 Cannabis abuse, uncomplicated: Secondary | ICD-10-CM | POA: Diagnosis not present

## 2020-04-27 DIAGNOSIS — R519 Headache, unspecified: Secondary | ICD-10-CM | POA: Diagnosis not present

## 2020-04-27 DIAGNOSIS — R52 Pain, unspecified: Secondary | ICD-10-CM | POA: Diagnosis not present

## 2020-04-27 NOTE — ED Triage Notes (Signed)
Pt from home with c/o headache x3 days tylenol 650 mg ineffective

## 2020-04-28 ENCOUNTER — Emergency Department (HOSPITAL_COMMUNITY)
Admission: EM | Admit: 2020-04-28 | Discharge: 2020-04-28 | Disposition: A | Discharge status: Home / Self Care | Payer: Medicare Other | Attending: Emergency Medicine | Admitting: Emergency Medicine

## 2020-04-28 DIAGNOSIS — R519 Headache, unspecified: Secondary | ICD-10-CM

## 2020-04-28 MED ORDER — SODIUM CHLORIDE 0.9 % IV BOLUS
1000.0000 mL | Freq: Once | INTRAVENOUS | Status: AC
  Administered 2020-04-28: 1000 mL via INTRAVENOUS

## 2020-04-28 MED ORDER — KETOROLAC TROMETHAMINE 15 MG/ML IJ SOLN
15.0000 mg | Freq: Once | INTRAMUSCULAR | Status: AC
  Administered 2020-04-28: 15 mg via INTRAVENOUS
  Filled 2020-04-28: qty 1

## 2020-04-28 MED ORDER — METOCLOPRAMIDE HCL 5 MG/ML IJ SOLN
10.0000 mg | Freq: Once | INTRAMUSCULAR | Status: AC
  Administered 2020-04-28: 10 mg via INTRAVENOUS
  Filled 2020-04-28: qty 2

## 2020-04-28 MED ORDER — DIPHENHYDRAMINE HCL 50 MG/ML IJ SOLN
25.0000 mg | Freq: Once | INTRAMUSCULAR | Status: AC
  Administered 2020-04-28: 25 mg via INTRAVENOUS
  Filled 2020-04-28: qty 1

## 2020-04-28 NOTE — ED Provider Notes (Signed)
[facility]-EMERGENCY DEPT Provider Note: Dylan Dell, MD, FACEP  CSN: [id number] MRN: [id number] ARRIVAL: 04/27/20 at 2005 ROOM: WA13/WA13   CHIEF COMPLAINT  Headache   HISTORY OF PRESENT ILLNESS  04/28/20 1:52 AM Dylan Frank is a 34 y.o. male with autism spectrum disorder who states he had Covid in January of this year.  He is here with a headache that is been present for 3 days.  The headache involves the entire left side of his head.  He describes it as aching and rates it as a 10 out of 10.  He has taken Tylenol without relief.  He has associated photophobia but no nausea, vomiting or focal neurologic symptoms.   Past Medical History:  Diagnosis Date  . ADHD (attention deficit hyperactivity disorder)   . Asthma    prn inhaler  . Autism   . Depression   . Gynecomastia, male 05/2013  . History of cardiac murmur    as a child - states no problems; no murmur, per PCP note 10/22/2012  . Hypertension    under control with med., has been on med. x 3 mos.  . Seizures (HCC)   . Wears glasses     Past Surgical History:  Procedure Laterality Date  . BREAST REDUCTION SURGERY Bilateral 05/25/2013   Procedure: BILATERAL BREAST REDUCTION WITH LIPO ASSISTANCE left breast;  Surgeon: Louisa Second, MD;  Location: [facility];  Service: Plastics;  Laterality: Bilateral;  . CIRCUMCISION     age 53  . MEATOTOMY     age 53    Family History  Problem Relation Age of Onset  . Diabetes Mother   . Other Mother        visually impaired    Social History   Tobacco Use  . Smoking status: Never Smoker  . Smokeless tobacco: Never Used  Substance Use Topics  . Alcohol use: Yes  . Drug use: Yes    Types: Marijuana    Prior to Admission medications   Medication Sig Start Date End Date Taking? Authorizing Provider  divalproex (DEPAKOTE ER) 500 MG 24 hr tablet Take 500 mg by mouth every evening.     [provider]  lisinopril (PRINIVIL,ZESTRIL) 10 MG tablet TAKE 1  TABLET BY MOUTH DAILY 08/05/17   Nestor Ramp, MD  methylphenidate (CONCERTA) 18 MG CR tablet Take 18 mg by mouth every morning.    [provider]  QUEtiapine (SEROQUEL) 400 MG tablet Take 400 mg by mouth at bedtime.     [provider]  albuterol (PROVENTIL HFA;VENTOLIN HFA) 108 (90 BASE) MCG/ACT inhaler Inhale 2 puffs into the lungs every 6 (six) hours as needed for wheezing. Patient not taking: Reported on 04/09/2017 03/02/14 04/28/20  Nestor Ramp, MD  fluticasone [facility]) 50 MCG/ACT nasal spray Place 1 spray into both nostrils daily. 12/23/13 04/28/20  Nestor Ramp, MD    Allergies Mushroom extract complex   REVIEW OF SYSTEMS  Negative except as noted here or in the History of Present Illness.   PHYSICAL EXAMINATION  Initial Vital Signs Blood pressure (!) 137/104, pulse 92, temperature 98.5 F (36.9 C), temperature source Oral, resp. rate 18, SpO2 98 %.  Examination General: Well-developed, well-nourished male in no acute distress; appearance consistent with age of record HENT: normocephalic; atraumatic Eyes: pupils equal, round and reactive to light; extraocular muscles intact; photophobia Neck: supple Heart: regular rate and rhythm Lungs: clear to auscultation bilaterally Abdomen: soft; nondistended; nontender; bowel sounds present Extremities:  No deformity; full range of motion; pulses normal Neurologic: Awake, alert; motor function intact in all extremities and symmetric; no facial droop Skin: Warm and dry Psychiatric: Flat affect   RESULTS  Summary of this visit's results, reviewed and interpreted by myself:   EKG Interpretation  Date/Time:    Ventricular Rate:    PR Interval:    QRS Duration:   QT Interval:    QTC Calculation:   R Axis:     Text Interpretation:        Laboratory Studies: No results found for this or any previous visit (from the past 24 hour(s)). Imaging Studies: No results found.  ED COURSE and MDM  Nursing notes,  initial and subsequent vitals signs, including pulse oximetry, reviewed and interpreted by myself.  Vitals:   04/27/20 2022 04/28/20 0106  BP: (!) 154/97 (!) 137/104  Pulse: 78 92  Resp: 18 18  Temp: 98.5 F (36.9 C)   TempSrc: Oral   SpO2: 100% 98%   Medications  sodium chloride 0.9 % bolus 1,000 mL (1,000 mLs Intravenous New Bag/Given 04/28/20 0253)  diphenhydrAMINE (BENADRYL) injection 25 mg (25 mg Intravenous Given 04/28/20 0254)  metoCLOPramide (REGLAN) injection 10 mg (10 mg Intravenous Given 04/28/20 0256)  ketorolac (TORADOL) 15 MG/ML injection 15 mg (15 mg Intravenous Given 04/28/20 0255)   3:54 AM Patient states his headache is somewhat better by IV medications.  Patient's nurse tells me he was more concerned about getting a [place] sandwich earlier that he was about receiving medications for his headache.  4:40 AM Patient sleeping in no acute distress.  Has ambulated without difficulty.  He has no neurologic symptoms to suggest acute intracranial pathology.   PROCEDURES  Procedures   ED DIAGNOSES     ICD-10-CM   1. Bad headache  R51.9        Illene Sweeting, MD 04/28/20 [phone]

## 2020-05-03 ENCOUNTER — Ambulatory Visit: Payer: Medicare Other | Attending: Family

## 2020-05-03 DIAGNOSIS — Z23 Encounter for immunization: Secondary | ICD-10-CM

## 2020-05-03 NOTE — Progress Notes (Signed)
Covid-19 Vaccination Clinic  Name:  Dylan Frank    MRN: [id number] DOB: October 27, 1986  05/03/2020  Mr. Dylan Frank was observed post Covid-19 immunization for 15 minutes without incident. He was provided with Vaccine Information Sheet and instruction to access the V-Safe system.   Mr. Dylan Frank was instructed to call 911 with any severe reactions post vaccine: Marland Kitchen Difficulty breathing  . Swelling of face and throat  . A fast heartbeat  . A bad rash all over body  . Dizziness and weakness   Immunizations Administered    Name Date Dose VIS Date Route   Moderna COVID-19 Vaccine 05/03/2020 12:54 PM 0.5 mL 11/2019 Intramuscular   Manufacturer: Moderna   Lot: [id number]   NDC: 95638-756-43

## 2021-04-10 ENCOUNTER — Emergency Department (HOSPITAL_COMMUNITY)
Admission: EM | Admit: 2021-04-10 | Discharge: 2021-04-10 | Disposition: A | Discharge status: Home / Self Care | Payer: Medicare Other | Attending: Emergency Medicine | Admitting: Emergency Medicine

## 2021-04-10 ENCOUNTER — Other Ambulatory Visit: Payer: Self-pay

## 2021-04-10 DIAGNOSIS — R197 Diarrhea, unspecified: Secondary | ICD-10-CM | POA: Diagnosis not present

## 2021-04-10 DIAGNOSIS — J45909 Unspecified asthma, uncomplicated: Secondary | ICD-10-CM | POA: Diagnosis not present

## 2021-04-10 DIAGNOSIS — Z79899 Other long term (current) drug therapy: Secondary | ICD-10-CM | POA: Insufficient documentation

## 2021-04-10 DIAGNOSIS — Z0279 Encounter for issue of other medical certificate: Secondary | ICD-10-CM | POA: Diagnosis present

## 2021-04-10 DIAGNOSIS — I10 Essential (primary) hypertension: Secondary | ICD-10-CM | POA: Diagnosis not present

## 2021-04-10 NOTE — Discharge Instructions (Signed)
You may return to work tomorrow. 

## 2021-04-10 NOTE — ED Provider Notes (Signed)
[facility] EMERGENCY DEPARTMENT Provider Note   CSN: [id number] Arrival date & time: 04/10/21  1855     History Chief Complaint  Patient presents with  . Follow-up    Dylan Frank is a 35 y.o. male with a past medical history as noted below who presents to the ED requesting a work note. He notes he had diarrhea last week Monday to Friday. No symptoms since Friday. No fever or chills. Patient denies nausea or vomiting. No physical complaints at this time.  History obtained from patient and past medical records. No interpreter used during encounter.       Past Medical History:  Diagnosis Date  . ADHD (attention deficit hyperactivity disorder)   . Asthma    prn inhaler  . Autism   . Depression   . Gynecomastia, male 05/2013  . History of cardiac murmur    as a child - states no problems; no murmur, per PCP note 10/22/2012  . Hypertension    under control with med., has been on med. x 3 mos.  . Seizures (HCC)   . Wears glasses     Patient Active Problem List   Diagnosis Date Noted  . Dysuria 08/03/2016  . High risk sexual behavior 08/03/2016  . Penile lesion 04/12/2016  . Urethritis 04/12/2016  . Positive hepatitis C antibody test 10/14/2015  . Viral illness 04/02/2014  . Gynecomastia, male 02/11/2013  . Conduct disorder, 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  . Metabolic syndrome 07/01/2012  . Medication management 07/01/2012  . HTN (hypertension) 07/01/2012  . ATTENTION DEFICIT, W/HYPERACTIVITY 02/13/2007  . ASTHMA, UNSPECIFIED 02/13/2007    Past Surgical History:  Procedure Laterality Date  . BREAST REDUCTION SURGERY Bilateral 05/25/2013   Procedure: BILATERAL BREAST REDUCTION WITH LIPO ASSISTANCE left breast;  Surgeon: Louisa Second, MD;  Location: [facility];  Service: Plastics;  Laterality: Bilateral;  . CIRCUMCISION     age 62  . MEATOTOMY     age 62       Family History  Problem Relation Age of Onset  . Diabetes Mother    . Other Mother        visually impaired    Social History   Tobacco Use  . Smoking status: Never Smoker  . Smokeless tobacco: Never Used  Substance Use Topics  . Alcohol use: Yes  . Drug use: Yes    Types: Marijuana    Home Medications Prior to Admission medications   Medication Sig Start Date End Date Taking? Authorizing Provider  divalproex (DEPAKOTE ER) 500 MG 24 hr tablet Take 500 mg by mouth every evening.     [provider]  lisinopril (PRINIVIL,ZESTRIL) 10 MG tablet TAKE 1 TABLET BY MOUTH DAILY 08/05/17   Nestor Ramp, MD  methylphenidate (CONCERTA) 18 MG CR tablet Take 18 mg by mouth every morning.    [provider]  QUEtiapine (SEROQUEL) 400 MG tablet Take 400 mg by mouth at bedtime.     [provider]  albuterol (PROVENTIL HFA;VENTOLIN HFA) 108 (90 BASE) MCG/ACT inhaler Inhale 2 puffs into the lungs every 6 (six) hours as needed for wheezing. Patient not taking: Reported on 04/09/2017 03/02/14 04/28/20  Nestor Ramp, MD  fluticasone [facility]) 50 MCG/ACT nasal spray Place 1 spray into both nostrils daily. 12/23/13 04/28/20  Nestor Ramp, MD    Allergies    Mushroom extract complex  Review of Systems   Review of Systems  Gastrointestinal: Positive for diarrhea (resolved).  All other systems reviewed and are negative.   Physical Exam Updated Vital Signs BP 123/81 (BP Location: Left Arm)   Pulse 63   Temp 98.5 F (36.9 C) (Oral)   Resp 16   SpO2 100%   Physical Exam Vitals and nursing note reviewed.  Constitutional:      General: He is not in acute distress.    Appearance: He is not ill-appearing.  HENT:     Head: Normocephalic.  Eyes:     Pupils: Pupils are equal, round, and reactive to light.  Cardiovascular:     Rate and Rhythm: Normal rate and regular rhythm.     Pulses: Normal pulses.     Heart sounds: Normal heart sounds. No murmur heard. No friction rub. No gallop.   Pulmonary:     Effort: Pulmonary effort is normal.      Breath sounds: Normal breath sounds.  Abdominal:     General: Abdomen is flat. There is no distension.     Palpations: Abdomen is soft.     Tenderness: There is no abdominal tenderness. There is no guarding or rebound.  Musculoskeletal:        General: Normal range of motion.     Cervical back: Neck supple.  Skin:    General: Skin is warm and dry.  Neurological:     General: No focal deficit present.     Mental Status: He is alert.  Psychiatric:        Mood and Affect: Mood normal.        Behavior: Behavior normal.     ED Results / Procedures / Treatments   Labs (all labs ordered are listed, but only abnormal results are displayed) Labs Reviewed - No data to display  EKG None  Radiology No results found.  Procedures Procedures   Medications Ordered in ED Medications - No data to display  ED Course  I have reviewed the triage vital signs and the nursing notes.  Pertinent labs & imaging results that were available during my care of the patient were reviewed by me and considered in my medical decision making (see chart for details).    MDM Rules/Calculators/A&P                         Patient requesting work note after being out of work Monday-Friday due to diarrhea. Symptoms resolved Friday. No fever or chills. No complaints at this time. Vitals all within normal limits. Abdomen soft, non-distended, and non-tender. Work note provided. Strict ED precautions discussed with patient. Patient states understanding and agrees to plan. Patient discharged home in no acute distress and stable vitals.  Final Clinical Impression(s) / ED Diagnoses Final diagnoses:  Diarrhea, unspecified type    Rx / DC Orders ED Discharge Orders    None       Jesusita Oka 04/10/21 1928    Koleen Distance, MD 04/10/21 2118

## 2021-04-10 NOTE — ED Triage Notes (Signed)
Pt denies symptoms, no fever, chills, CP or SOB.; Pt reports vomiting and diarrhea last week. Symptoms resolved Saturday. Pt reports he needs a note to return to work. VSS.

## 2021-10-23 ENCOUNTER — Other Ambulatory Visit: Payer: Self-pay

## 2021-10-23 ENCOUNTER — Encounter (HOSPITAL_COMMUNITY): Payer: Self-pay | Admitting: Emergency Medicine

## 2021-10-23 ENCOUNTER — Emergency Department (HOSPITAL_COMMUNITY)
Admission: EM | Admit: 2021-10-23 | Discharge: 2021-10-24 | Disposition: A | Discharge status: Home / Self Care | Payer: Medicare Other | Attending: Emergency Medicine | Admitting: Emergency Medicine

## 2021-10-23 DIAGNOSIS — Z711 Person with feared health complaint in whom no diagnosis is made: Secondary | ICD-10-CM | POA: Diagnosis not present

## 2021-10-23 DIAGNOSIS — Z79899 Other long term (current) drug therapy: Secondary | ICD-10-CM | POA: Diagnosis not present

## 2021-10-23 DIAGNOSIS — F840 Autistic disorder: Secondary | ICD-10-CM | POA: Insufficient documentation

## 2021-10-23 DIAGNOSIS — R112 Nausea with vomiting, unspecified: Secondary | ICD-10-CM | POA: Diagnosis not present

## 2021-10-23 DIAGNOSIS — R4582 Worries: Secondary | ICD-10-CM | POA: Diagnosis not present

## 2021-10-23 DIAGNOSIS — I10 Essential (primary) hypertension: Secondary | ICD-10-CM | POA: Diagnosis not present

## 2021-10-23 NOTE — ED Triage Notes (Signed)
Patient requesting work note , he did not go to work last Friday " I'm burned out".

## 2021-10-24 NOTE — ED Notes (Signed)
DC instructions reviewed with pt. PT verbalized understanding. Pt DC °

## 2021-10-24 NOTE — ED Provider Notes (Signed)
[facility] EMERGENCY DEPARTMENT Provider Note   CSN: [id number] Arrival date & time: 10/23/21  1841     History Chief Complaint  Patient presents with   Work Note    Dylan Frank is a 35 y.o. male.  HPI     This is a 35 year old male with a history of asthma, autism who presents requesting a work note.  Patient reports he had an episode of nausea and vomiting on Friday evening.  By Saturday morning he felt better.  He he missed work during this time.  Denies any fevers, chest pain, shortness of breath, abdominal pain.  Currently he has been symptom-free for over 48 hours.  His work requires a note to return.  Past Medical History:  Diagnosis Date   ADHD (attention deficit hyperactivity disorder)    Asthma    prn inhaler   Autism    Depression    Gynecomastia, male 05/2013   History of cardiac murmur    as a child - states no problems; no murmur, per PCP note 10/22/2012   Hypertension    under control with med., has been on med. x 3 mos.   Seizures (HCC)    Wears glasses     Patient Active Problem List   Diagnosis Date Noted   Dysuria 08/03/2016   High risk sexual behavior 08/03/2016   Penile lesion 04/12/2016   Urethritis 04/12/2016   Positive hepatitis C antibody test 10/14/2015   Viral illness 04/02/2014   Gynecomastia, male 02/11/2013   Conduct disorder, 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   Metabolic syndrome 07/01/2012   Medication management 07/01/2012   HTN (hypertension) 07/01/2012   ATTENTION DEFICIT, W/HYPERACTIVITY 02/13/2007   ASTHMA, UNSPECIFIED 02/13/2007    Past Surgical History:  Procedure Laterality Date   BREAST REDUCTION SURGERY Bilateral 05/25/2013   Procedure: BILATERAL BREAST REDUCTION WITH LIPO ASSISTANCE left breast;  Surgeon: Louisa Second, MD;  Location: [facility];  Service: Plastics;  Laterality: Bilateral;   CIRCUMCISION     age 31   MEATOTOMY     age 31       Family History  Problem Relation  Age of Onset   Diabetes Mother    Other Mother        visually impaired    Social History   Tobacco Use   Smoking status: Never   Smokeless tobacco: Never  Substance Use Topics   Alcohol use: Yes   Drug use: Yes    Types: Marijuana    Home Medications Prior to Admission medications   Medication Sig Start Date End Date Taking? Authorizing Provider  divalproex (DEPAKOTE ER) 500 MG 24 hr tablet Take 500 mg by mouth every evening.     [provider]  lisinopril (PRINIVIL,ZESTRIL) 10 MG tablet TAKE 1 TABLET BY MOUTH DAILY 08/05/17   Nestor Ramp, MD  methylphenidate (CONCERTA) 18 MG CR tablet Take 18 mg by mouth every morning.    [provider]  QUEtiapine (SEROQUEL) 400 MG tablet Take 400 mg by mouth at bedtime.     [provider]  albuterol (PROVENTIL HFA;VENTOLIN HFA) 108 (90 BASE) MCG/ACT inhaler Inhale 2 puffs into the lungs every 6 (six) hours as needed for wheezing. Patient not taking: Reported on 04/09/2017 03/02/14 04/28/20  Nestor Ramp, MD  fluticasone [facility]) 50 MCG/ACT nasal spray Place 1 spray into both nostrils daily. 12/23/13 04/28/20  Nestor Ramp, MD    Allergies    Mushroom extract complex  Review of Systems   Review of Systems  Constitutional:  Negative for fever.  Respiratory:  Negative for shortness of breath.   Cardiovascular:  Negative for chest pain.  Gastrointestinal:  Positive for nausea and vomiting. Negative for abdominal pain.  All other systems reviewed and are negative.  Physical Exam Updated Vital Signs BP 122/86   Pulse 78   Temp 98.2 F (36.8 C)   Resp 17   Ht 1.753 m (5\' 9" )   Wt 90 kg   SpO2 98%   BMI 29.30 kg/m   Physical Exam Vitals and nursing note reviewed.  Constitutional:      Appearance: He is well-developed. He is not ill-appearing.  HENT:     Head: Normocephalic and atraumatic.     Nose: Nose normal.     Mouth/Throat:     Mouth: Mucous membranes are moist.  Cardiovascular:     Rate and  Rhythm: Normal rate and regular rhythm.  Pulmonary:     Effort: Pulmonary effort is normal. No respiratory distress.  Abdominal:     Palpations: Abdomen is soft.  Musculoskeletal:     Cervical back: Neck supple.     Right lower leg: No edema.     Left lower leg: No edema.  Skin:    General: Skin is warm and dry.  Neurological:     Mental Status: He is alert and oriented to person, place, and time.  Psychiatric:        Mood and Affect: Mood normal.    ED Results / Procedures / Treatments   Labs (all labs ordered are listed, but only abnormal results are displayed) Labs Reviewed - No data to display  EKG None  Radiology No results found.  Procedures Procedures   Medications Ordered in ED Medications - No data to display  ED Course  I have reviewed the triage vital signs and the nursing notes.  Pertinent labs & imaging results that were available during my care of the patient were reviewed by me and considered in my medical decision making (see chart for details).    MDM Rules/Calculators/A&P                           Patient presents requesting a work note.  He requires just returned to work.  He has not had symptoms in over 48 hours.  He is nontoxic-appearing and vital signs are reassuring.  Work note was provided to indicate that he missed work and based on history had nausea and vomiting.  After history, exam, and medical workup I feel the patient has been appropriately medically screened and is safe for discharge home. Pertinent diagnoses were discussed with the patient. Patient was given return precautions.  Final Clinical Impression(s) / ED Diagnoses Final diagnoses:  Worried well    Rx / DC Orders ED Discharge Orders     None        Norvin Ohlin, , MD 10/24/21 0130

## 2022-03-11 ENCOUNTER — Emergency Department (HOSPITAL_COMMUNITY): Payer: Medicare Other

## 2022-03-11 ENCOUNTER — Other Ambulatory Visit: Payer: Self-pay

## 2022-03-11 ENCOUNTER — Emergency Department (HOSPITAL_COMMUNITY)
Admission: EM | Admit: 2022-03-11 | Discharge: 2022-03-11 | Disposition: A | Discharge status: Home / Self Care | Payer: Medicare Other | Attending: Emergency Medicine | Admitting: Emergency Medicine

## 2022-03-11 DIAGNOSIS — R0789 Other chest pain: Secondary | ICD-10-CM | POA: Insufficient documentation

## 2022-03-11 DIAGNOSIS — R079 Chest pain, unspecified: Secondary | ICD-10-CM | POA: Diagnosis not present

## 2022-03-11 LAB — CBC
HCT: 45.7 % (ref 39.0–52.0)
Hemoglobin: 14.6 g/dL (ref 13.0–17.0)
MCH: 30.9 pg (ref 26.0–34.0)
MCHC: 31.9 g/dL (ref 30.0–36.0)
MCV: 96.8 fL (ref 80.0–100.0)
Platelets: 246 10*3/uL (ref 150–400)
RBC: 4.72 MIL/uL (ref 4.22–5.81)
RDW: 11.3 % — ABNORMAL LOW (ref 11.5–15.5)
WBC: 8.2 10*3/uL (ref 4.0–10.5)
nRBC: 0 % (ref 0.0–0.2)

## 2022-03-11 LAB — TROPONIN I (HIGH SENSITIVITY): Troponin I (High Sensitivity): 3 ng/L (ref <18)

## 2022-03-11 LAB — BASIC METABOLIC PANEL
Anion gap: 5 (ref 5–15)
BUN: 11 mg/dL (ref 6–20)
CO2: 27 mmol/L (ref 22–32)
Calcium: 9.2 mg/dL (ref 8.9–10.3)
Chloride: 108 mmol/L (ref 98–111)
Creatinine, Ser: 0.8 mg/dL (ref 0.61–1.24)
GFR, Estimated: >60 mL/min (ref >60)
Glucose, Bld: 96 mg/dL (ref 70–99)
Potassium: 3.7 mmol/L (ref 3.5–5.1)
Sodium: 140 mmol/L (ref 135–145)

## 2022-03-11 NOTE — ED Provider Notes (Signed)
?[facility]-EMERGENCY DEPT ?Provider Note ? ? ?CSN: [id number] ?Arrival date & time: 03/11/22  1649 ? ?  ? ?History ? ?Chief Complaint  ?Patient presents with  ? Chest Pain  ? ? ?COYE DAWOOD is a 36 y.o. male. ? ?Patient presents with right-sided chest pain has been there for the past 2 days.  Triage states 5 days, however the patient describes 2 days of pain.  States it aching in the right chest, worse when he pushes on his right chest.  Worse when he takes a deep breath.  Denies any fevers or cough no vomiting or diarrhea.  Denies any trauma to his recollection. ? ? ?  ? ?Home Medications ?Prior to Admission medications   ?Medication Sig Start Date End Date Taking? Authorizing Provider  ?divalproex (DEPAKOTE ER) 500 MG 24 hr tablet Take 500 mg by mouth every evening.     [provider]  ?lisinopril (PRINIVIL,ZESTRIL) 10 MG tablet TAKE 1 TABLET BY MOUTH DAILY 08/05/17   Nestor Ramp, MD  ?methylphenidate (CONCERTA) 18 MG CR tablet Take 18 mg by mouth every morning.    [provider]  ?QUEtiapine (SEROQUEL) 400 MG tablet Take 400 mg by mouth at bedtime.     [provider]  ?albuterol (PROVENTIL HFA;VENTOLIN HFA) 108 (90 BASE) MCG/ACT inhaler Inhale 2 puffs into the lungs every 6 (six) hours as needed for wheezing. ?Patient not taking: Reported on 04/09/2017 03/02/14 04/28/20  Nestor Ramp, MD  ?fluticasone [facility]) 50 MCG/ACT nasal spray Place 1 spray into both nostrils daily. 12/23/13 04/28/20  Nestor Ramp, MD  ?   ? ?Allergies    ?Mushroom extract complex   ? ?Review of Systems   ?Review of Systems  ?Constitutional:  Negative for fever.  ?HENT:  Negative for ear pain and sore throat.   ?Eyes:  Negative for pain.  ?Respiratory:  Negative for cough.   ?Cardiovascular:  Positive for chest pain.  ?Gastrointestinal:  Negative for abdominal pain.  ?Genitourinary:  Negative for flank pain.  ?Musculoskeletal:  Negative for back pain.  ?Skin:  Negative for color change and rash.   ?Neurological:  Negative for syncope.  ?All other systems reviewed and are negative. ? ?Physical Exam ?Updated Vital Signs ?BP 128/78 (BP Location: Right Arm)   Pulse 66   Temp 98 ?F (36.7 ?C) (Oral)   Resp 18   SpO2 98%  ?Physical Exam ?Constitutional:   ?   Appearance: He is well-developed.  ?HENT:  ?   Head: Normocephalic.  ?   Nose: Nose normal.  ?Eyes:  ?   Extraocular Movements: Extraocular movements intact.  ?Cardiovascular:  ?   Rate and Rhythm: Normal rate.  ?Pulmonary:  ?   Effort: Pulmonary effort is normal.  ?Chest:  ?   Comments: Right chest wall tenderness palpation reproduces his pain. ?Skin: ?   Coloration: Skin is not jaundiced.  ?Neurological:  ?   Mental Status: He is alert. Mental status is at baseline.  ? ? ?ED Results / Procedures / Treatments   ?Labs ?(all labs ordered are listed, but only abnormal results are displayed) ?Labs Reviewed  ?CBC - Abnormal; Notable for the following components:  ?    Result Value  ? RDW 11.3 (*)   ? All other components within normal limits  ?BASIC METABOLIC PANEL  ?TROPONIN I (HIGH SENSITIVITY)  ? ? ?EKG ?EKG Interpretation ? ?Date/Time:  Sunday March 11 2022 20:31:42 EDT ?Ventricular Rate:  75 ?PR Interval:  146 ?QRS Duration: 102 ?QT Interval:  360 ?QTC Calculation: 402 ?R Axis:   12 ?Text Interpretation: Sinus rhythm ST elev, probable normal early repol pattern Confirmed by Norman Clay ([id number]) on 03/11/2022 8:41:44 PM ? ?Radiology ?DG Chest 2 View ? ?Result Date: 03/11/2022 ?CLINICAL DATA:  Chest pain EXAM: CHEST - 2 VIEW COMPARISON:  04/09/2017 FINDINGS: The heart size and mediastinal contours are within normal limits. Both lungs are clear. The visualized skeletal structures are unremarkable. IMPRESSION: No active cardiopulmonary disease. Electronically Signed   By: Ernie Avena M.D.   On: 03/11/2022 17:36   ? ?Procedures ?Procedures  ? ? ?Medications Ordered in ED ?Medications - No data to display ? ?ED Course/ Medical Decision Making/ A&P ?  ?                         ?Medical Decision Making ?Amount and/or Complexity of Data Reviewed ?Labs: ordered. ?Radiology: ordered. ? ? ?Review of records shows prior ER visit in November 2022 for well exam. ? ?Work-up today includes labs CBC normal chemistry normal troponin is 3 and normal. ? ?Chest x-ray is unremarkable per radiologist. ? ?EKG shows a flipped T wave in the lead III which appears new but no additional T wave inversion seen in aVF and lead II.  There is some ST elevation in aVL but not in any additional lead. ? ?Patient describes symptoms ongoing for the past 2 to 5 days with negative troponin, borderline EKG changes. ? ?Recommending outpatient follow-up with cardiology within the week.  Advised immediate return for worsening symptoms worsening pain or any additional concerns. ? ? ? ? ? ? ? ?Final Clinical Impression(s) / ED Diagnoses ?Final diagnoses:  ?Nonspecific chest pain  ? ? ?Rx / DC Orders ?ED Discharge Orders   ? ? None  ? ?  ? ? ?  ?Cheryll Cockayne, MD ?03/11/22 2042 ? ?

## 2022-03-11 NOTE — ED Provider Triage Note (Signed)
Emergency Medicine Provider Triage Evaluation Note ? ?Alfonso Ramus , a 36 y.o. male  was evaluated in triage.  Pt complains of chest pain that has been present for the last 5 days. He reports associated pleuritic pain as well. . ? ?Review of Systems  ?Positive: Chest pain, pleuritic pain ?Negative: Cough, leg swelling ? ?Physical Exam  ?BP 134/82 (BP Location: Right Arm)   Pulse 70   Temp 98 ?F (36.7 ?C) (Oral)   Resp 18   SpO2 97%  ?Gen:   Awake, no distress   ?Resp:  Normal effort  ?MSK:   Moves extremities without difficulty  ?Other:  Heart with rrr, lungs ctab ? ?Medical Decision Making  ?Medically screening exam initiated at 5:30 PM.  Appropriate orders placed.  JUSTIN MEISENHEIMER was informed that the remainder of the evaluation will be completed by another provider, this initial triage assessment does not replace that evaluation, and the importance of remaining in the ED until their evaluation is complete. ? ? ?  ?Karrie Meres, PA-C ?03/11/22 1732 ? ?

## 2022-03-11 NOTE — ED Triage Notes (Signed)
Pt reports right sided chest pain x several days. No n/v  No shob. Worse with deep breaths.  No cough or fever.  ?

## 2022-08-05 ENCOUNTER — Emergency Department (HOSPITAL_COMMUNITY): Payer: Medicare Other

## 2022-08-05 ENCOUNTER — Other Ambulatory Visit: Payer: Self-pay

## 2022-08-05 ENCOUNTER — Emergency Department (HOSPITAL_COMMUNITY)
Admission: EM | Admit: 2022-08-05 | Discharge: 2022-08-05 | Disposition: A | Discharge status: Home / Self Care | Payer: Medicare Other | Attending: Emergency Medicine | Admitting: Emergency Medicine

## 2022-08-05 ENCOUNTER — Encounter (HOSPITAL_COMMUNITY): Payer: Self-pay

## 2022-08-05 DIAGNOSIS — R112 Nausea with vomiting, unspecified: Secondary | ICD-10-CM | POA: Insufficient documentation

## 2022-08-05 DIAGNOSIS — I10 Essential (primary) hypertension: Secondary | ICD-10-CM | POA: Insufficient documentation

## 2022-08-05 DIAGNOSIS — R1031 Right lower quadrant pain: Secondary | ICD-10-CM | POA: Diagnosis not present

## 2022-08-05 DIAGNOSIS — R197 Diarrhea, unspecified: Secondary | ICD-10-CM | POA: Insufficient documentation

## 2022-08-05 DIAGNOSIS — R109 Unspecified abdominal pain: Secondary | ICD-10-CM | POA: Diagnosis not present

## 2022-08-05 DIAGNOSIS — Z20822 Contact with and (suspected) exposure to covid-19: Secondary | ICD-10-CM | POA: Insufficient documentation

## 2022-08-05 DIAGNOSIS — K529 Noninfective gastroenteritis and colitis, unspecified: Secondary | ICD-10-CM

## 2022-08-05 LAB — CBC WITH DIFFERENTIAL/PLATELET
Abs Immature Granulocytes: 0.02 10*3/uL (ref 0.00–0.07)
Basophils Absolute: 0 10*3/uL (ref 0.0–0.1)
Basophils Relative: 0 %
Eosinophils Absolute: 0 10*3/uL (ref 0.0–0.5)
Eosinophils Relative: 0 %
HCT: 45.4 % (ref 39.0–52.0)
Hemoglobin: 15 g/dL (ref 13.0–17.0)
Immature Granulocytes: 0 %
Lymphocytes Relative: 13 %
Lymphs Abs: 0.9 10*3/uL (ref 0.7–4.0)
MCH: 30.5 pg (ref 26.0–34.0)
MCHC: 33 g/dL (ref 30.0–36.0)
MCV: 92.5 fL (ref 80.0–100.0)
Monocytes Absolute: 0.9 10*3/uL (ref 0.1–1.0)
Monocytes Relative: 12 %
Neutro Abs: 5.1 10*3/uL (ref 1.7–7.7)
Neutrophils Relative %: 75 %
Platelets: 208 10*3/uL (ref 150–400)
RBC: 4.91 MIL/uL (ref 4.22–5.81)
RDW: 11.3 % — ABNORMAL LOW (ref 11.5–15.5)
WBC: 6.9 10*3/uL (ref 4.0–10.5)
nRBC: 0 % (ref 0.0–0.2)

## 2022-08-05 LAB — COMPREHENSIVE METABOLIC PANEL
ALT: 10 U/L (ref 0–44)
AST: 20 U/L (ref 15–41)
Albumin: 3.9 g/dL (ref 3.5–5.0)
Alkaline Phosphatase: 58 U/L (ref 38–126)
Anion gap: 10 (ref 5–15)
BUN: 7 mg/dL (ref 6–20)
CO2: 23 mmol/L (ref 22–32)
Calcium: 9.3 mg/dL (ref 8.9–10.3)
Chloride: 104 mmol/L (ref 98–111)
Creatinine, Ser: 0.91 mg/dL (ref 0.61–1.24)
GFR, Estimated: >60 mL/min (ref >60)
Glucose, Bld: 116 mg/dL — ABNORMAL HIGH (ref 70–99)
Potassium: 3.4 mmol/L — ABNORMAL LOW (ref 3.5–5.1)
Sodium: 137 mmol/L (ref 135–145)
Total Bilirubin: 1 mg/dL (ref 0.3–1.2)
Total Protein: 7.9 g/dL (ref 6.5–8.1)

## 2022-08-05 LAB — URINALYSIS, ROUTINE W REFLEX MICROSCOPIC
Bacteria, UA: NONE SEEN
Bilirubin Urine: NEGATIVE
Glucose, UA: NEGATIVE mg/dL
Ketones, ur: NEGATIVE mg/dL
Leukocytes,Ua: NEGATIVE
Nitrite: NEGATIVE
Protein, ur: NEGATIVE mg/dL
Specific Gravity, Urine: 1.017 (ref 1.005–1.030)
pH: 5 (ref 5.0–8.0)

## 2022-08-05 LAB — SARS CORONAVIRUS 2 BY RT PCR: SARS Coronavirus 2 by RT PCR: NEGATIVE

## 2022-08-05 LAB — LIPASE, BLOOD: Lipase: 23 U/L (ref 11–51)

## 2022-08-05 MED ORDER — SODIUM CHLORIDE 0.9 % IV BOLUS
1000.0000 mL | Freq: Once | INTRAVENOUS | Status: AC
  Administered 2022-08-05: 1000 mL via INTRAVENOUS

## 2022-08-05 MED ORDER — ONDANSETRON 4 MG PO TBDP: ORAL_TABLET | ORAL | 0 refills | Status: DC

## 2022-08-05 MED ORDER — IOHEXOL 300 MG/ML  SOLN
100.0000 mL | Freq: Once | INTRAMUSCULAR | Status: AC | PRN
  Administered 2022-08-05: 100 mL via INTRAVENOUS

## 2022-08-05 MED ORDER — ONDANSETRON HCL 4 MG/2ML IJ SOLN
4.0000 mg | Freq: Once | INTRAMUSCULAR | Status: AC
  Administered 2022-08-05: 4 mg via INTRAVENOUS
  Filled 2022-08-05: qty 2

## 2022-08-05 NOTE — Discharge Instructions (Signed)
You likely have colitis.  You can take Imodium up to 10 pills a day for diarrhea  I have prescribed Zofran as needed for nausea  See your doctor for follow-up   Return to ER if you have worse abdominal pain, vomiting, fever, dehydration

## 2022-08-05 NOTE — ED Triage Notes (Addendum)
Pt c/o abdominal pain, nausea, vomiting, and diarrhea since Friday. Pt states he did notice a small amount of blood in his stool, he tends to think it is from the excess diarrhea.

## 2022-08-05 NOTE — ED Provider Triage Note (Signed)
Emergency Medicine Provider Triage Evaluation Note  Dylan Frank , a 36 y.o. male  was evaluated in triage.  Pt complains of abd pain. Report lower abd pain x 3 days.  Endorse subjective fever, nausea, recurrent diarrhea and fatigue.  No cp, sob, cough, dysuria, hematuria.  Report feeling dehydrated  Review of Systems  Positive: As above Negative: As above  Physical Exam  BP (!) 139/93 (BP Location: Left Arm)   Pulse 91   Temp 99.4 F (37.4 C) (Oral)   Resp 16   Ht 5' 9.5" (1.765 m)   Wt 80.7 kg   SpO2 97%   BMI 25.91 kg/m  Gen:   Awake, no distress   Resp:  Normal effort  MSK:   Moves extremities without difficulty  Other:    Medical Decision Making  Medically screening exam initiated at 3:51 PM.  Appropriate orders placed.  Dylan Frank was informed that the remainder of the evaluation will be completed by another provider, this initial triage assessment does not replace that evaluation, and the importance of remaining in the ED until their evaluation is complete.     Fayrene Helper, PA-C 08/05/22 1555

## 2022-08-05 NOTE — ED Provider Notes (Signed)
[facility]-EMERGENCY DEPT Provider Note   CSN: [id number] Arrival date & time: 08/05/22  1443     History  Chief Complaint  Patient presents with   Abdominal Pain   Nausea   Emesis   Diarrhea    Dylan Frank is a 36 y.o. male history of hypertension, here presenting abdominal pain and vomiting and diarrhea.  Patient states that for the last 2 days, he has been having abdominal pain and nausea vomiting.  He states that he is unable to keep anything down.  He also has lower abdominal pain as well.  Also has some chills as well.  The history is provided by the patient.       Home Medications Prior to Admission medications   Medication Sig Start Date End Date Taking? Authorizing Provider  divalproex (DEPAKOTE ER) 500 MG 24 hr tablet Take 500 mg by mouth every evening.     [provider]  lisinopril (PRINIVIL,ZESTRIL) 10 MG tablet TAKE 1 TABLET BY MOUTH DAILY 08/05/17   Nestor Ramp, MD  methylphenidate (CONCERTA) 18 MG CR tablet Take 18 mg by mouth every morning.    [provider]  QUEtiapine (SEROQUEL) 400 MG tablet Take 400 mg by mouth at bedtime.     [provider]  albuterol (PROVENTIL HFA;VENTOLIN HFA) 108 (90 BASE) MCG/ACT inhaler Inhale 2 puffs into the lungs every 6 (six) hours as needed for wheezing. Patient not taking: Reported on 04/09/2017 03/02/14 04/28/20  Nestor Ramp, MD  fluticasone [facility]) 50 MCG/ACT nasal spray Place 1 spray into both nostrils daily. 12/23/13 04/28/20  Nestor Ramp, MD      Allergies    Mushroom extract complex    Review of Systems   Review of Systems  Gastrointestinal:  Positive for abdominal pain, diarrhea and vomiting.  All other systems reviewed and are negative.   Physical Exam Updated Vital Signs BP (!) 146/94   Pulse (!) 58   Temp 97.6 F (36.4 C) (Oral)   Resp 19   Ht 5' 9.5" (1.765 m)   Wt 80.7 kg   SpO2 100%   BMI 25.91 kg/m  Physical Exam Vitals and nursing note  reviewed.  Constitutional:      Comments: Slightly dehydrated  HENT:     Head: Normocephalic.     Mouth/Throat:     Pharynx: Oropharynx is clear.  Eyes:     Extraocular Movements: Extraocular movements intact.     Pupils: Pupils are equal, round, and reactive to light.  Cardiovascular:     Rate and Rhythm: Normal rate and regular rhythm.     Heart sounds: Normal heart sounds.  Pulmonary:     Effort: Pulmonary effort is normal.     Breath sounds: Normal breath sounds.  Abdominal:     General: Abdomen is flat.     Comments: Mild right lower quadrant tenderness  Skin:    General: Skin is warm.     Capillary Refill: Capillary refill takes less than 2 seconds.  Neurological:     General: No focal deficit present.     Mental Status: He is alert and oriented to person, place, and time.     ED Results / Procedures / Treatments   Labs (all labs ordered are listed, but only abnormal results are displayed) Labs Reviewed  CBC WITH DIFFERENTIAL/PLATELET - Abnormal; Notable for the following components:      Result Value   RDW 11.3 (*)    All other  components within normal limits  COMPREHENSIVE METABOLIC PANEL - Abnormal; Notable for the following components:   Potassium 3.4 (*)    Glucose, Bld 116 (*)    All other components within normal limits  URINALYSIS, ROUTINE W REFLEX MICROSCOPIC - Abnormal; Notable for the following components:   Hgb urine dipstick MODERATE (*)    All other components within normal limits  SARS CORONAVIRUS 2 BY RT PCR  LIPASE, BLOOD    EKG None  Radiology No results found.  Procedures Procedures    Medications Ordered in ED Medications  sodium chloride 0.9 % bolus 1,000 mL (1,000 mLs Intravenous New Bag/Given 08/05/22 1939)  ondansetron (ZOFRAN) injection 4 mg (4 mg Intravenous Given 08/05/22 1939)  iohexol (OMNIPAQUE) 300 MG/ML solution 100 mL (100 mLs Intravenous Contrast Given 08/05/22 2013)    ED Course/ Medical Decision Making/ A&P                            Medical Decision Making Dylan Frank is a 36 y.o. male here with right lower quadrant pain and nausea.  Considered viral gastroenteritis versus appendicitis.  Plan to get CBC and CMP and UA and CT abdomen pelvis.  Will hydrate and reassess.  10:20 PM I reviewed patient's labs and independently interpreted CT scan.  White blood cell count is normal.  Chemistry is unremarkable.  CT abdomen pelvis showed pancolitis.  He has no history of C. difficile and no recent antibiotic use. I think likely viral gastroenteritis.  He went to the bathroom multiple times and had diarrhea.  I will hold off on antibiotics and told him to take Imodium as needed and also   Problems Addressed: Colitis: acute illness or injury  Amount and/or Complexity of Data Reviewed Labs: ordered. Decision-making details documented in ED Course. Radiology: ordered and independent interpretation performed. Decision-making details documented in ED Course.  Risk Prescription drug management.    Final Clinical Impression(s) / ED Diagnoses Final diagnoses:  None    Rx / DC Orders ED Discharge Orders     None         Charlynne Pander, MD 08/05/22 2221

## 2022-08-05 NOTE — ED Notes (Signed)
Pt ambulated independently for toileting. Pt connected back to monitor. Pt lying in bed. Call bell within reach.

## 2023-08-26 ENCOUNTER — Emergency Department (HOSPITAL_COMMUNITY)
Admission: EM | Admit: 2023-08-26 | Discharge: 2023-08-26 | Disposition: A | Discharge status: Home / Self Care | Payer: 59 | Attending: Emergency Medicine | Admitting: Emergency Medicine

## 2023-08-26 ENCOUNTER — Other Ambulatory Visit: Payer: Self-pay

## 2023-08-26 ENCOUNTER — Encounter (HOSPITAL_COMMUNITY): Payer: Self-pay

## 2023-08-26 ENCOUNTER — Emergency Department (HOSPITAL_COMMUNITY): Payer: 59

## 2023-08-26 DIAGNOSIS — Z743 Need for continuous supervision: Secondary | ICD-10-CM | POA: Diagnosis not present

## 2023-08-26 DIAGNOSIS — R079 Chest pain, unspecified: Secondary | ICD-10-CM | POA: Diagnosis not present

## 2023-08-26 DIAGNOSIS — M940 Chondrocostal junction syndrome [Tietze]: Secondary | ICD-10-CM | POA: Insufficient documentation

## 2023-08-26 DIAGNOSIS — R0789 Other chest pain: Secondary | ICD-10-CM | POA: Diagnosis present

## 2023-08-26 LAB — COMPREHENSIVE METABOLIC PANEL
ALT: 10 U/L (ref 0–44)
AST: 20 U/L (ref 15–41)
Albumin: 3.5 g/dL (ref 3.5–5.0)
Alkaline Phosphatase: 61 U/L (ref 38–126)
Anion gap: 7 (ref 5–15)
BUN: 6 mg/dL (ref 6–20)
CO2: 27 mmol/L (ref 22–32)
Calcium: 8.8 mg/dL — ABNORMAL LOW (ref 8.9–10.3)
Chloride: 104 mmol/L (ref 98–111)
Creatinine, Ser: 1.07 mg/dL (ref 0.61–1.24)
GFR, Estimated: >60 mL/min (ref >60)
Glucose, Bld: 82 mg/dL (ref 70–99)
Potassium: 3.4 mmol/L — ABNORMAL LOW (ref 3.5–5.1)
Sodium: 138 mmol/L (ref 135–145)
Total Bilirubin: 0.6 mg/dL (ref 0.3–1.2)
Total Protein: 6.8 g/dL (ref 6.5–8.1)

## 2023-08-26 LAB — CBC WITH DIFFERENTIAL/PLATELET
Abs Immature Granulocytes: 0.01 10*3/uL (ref 0.00–0.07)
Basophils Absolute: 0 10*3/uL (ref 0.0–0.1)
Basophils Relative: 1 %
Eosinophils Absolute: 0.1 10*3/uL (ref 0.0–0.5)
Eosinophils Relative: 2 %
HCT: 40.7 % (ref 39.0–52.0)
Hemoglobin: 12.8 g/dL — ABNORMAL LOW (ref 13.0–17.0)
Immature Granulocytes: 0 %
Lymphocytes Relative: 18 %
Lymphs Abs: 1.5 10*3/uL (ref 0.7–4.0)
MCH: 30 pg (ref 26.0–34.0)
MCHC: 31.4 g/dL (ref 30.0–36.0)
MCV: 95.3 fL (ref 80.0–100.0)
Monocytes Absolute: 0.6 10*3/uL (ref 0.1–1.0)
Monocytes Relative: 8 %
Neutro Abs: 6.1 10*3/uL (ref 1.7–7.7)
Neutrophils Relative %: 71 %
Platelets: 240 10*3/uL (ref 150–400)
RBC: 4.27 MIL/uL (ref 4.22–5.81)
RDW: 11.2 % — ABNORMAL LOW (ref 11.5–15.5)
WBC: 8.4 10*3/uL (ref 4.0–10.5)
nRBC: 0 % (ref 0.0–0.2)

## 2023-08-26 LAB — TROPONIN I (HIGH SENSITIVITY): Troponin I (High Sensitivity): 3 ng/L (ref <18)

## 2023-08-26 LAB — MAGNESIUM: Magnesium: 1.9 mg/dL (ref 1.7–2.4)

## 2023-08-26 LAB — LIPASE, BLOOD: Lipase: 23 U/L (ref 11–51)

## 2023-08-26 MED ORDER — LIDOCAINE 5 % EX PTCH: 1.0000 | MEDICATED_PATCH | CUTANEOUS | 0 refills | Status: DC

## 2023-08-26 MED ORDER — ACETAMINOPHEN 325 MG PO TABS
650.0000 mg | ORAL_TABLET | Freq: Once | ORAL | Status: AC
  Administered 2023-08-26: 650 mg via ORAL
  Filled 2023-08-26: qty 2

## 2023-08-26 MED ORDER — IBUPROFEN 600 MG PO TABS: 600.0000 mg | ORAL_TABLET | Freq: Four times a day (QID) | ORAL | 0 refills | Status: DC | PRN

## 2023-08-26 MED ORDER — KETOROLAC TROMETHAMINE 30 MG/ML IJ SOLN
30.0000 mg | Freq: Once | INTRAMUSCULAR | Status: AC
  Administered 2023-08-26: 30 mg via INTRAMUSCULAR
  Filled 2023-08-26: qty 1

## 2023-08-26 MED ORDER — LIDOCAINE 5 % EX PTCH
1.0000 | MEDICATED_PATCH | CUTANEOUS | Status: DC
  Administered 2023-08-26: 1 via TRANSDERMAL
  Filled 2023-08-26: qty 1

## 2023-08-26 NOTE — ED Provider Triage Note (Signed)
Emergency Medicine Provider Triage Evaluation Note  Dylan Frank , a 37 y.o. male  was evaluated in triage.  Pt complains of chest pain patient may have had chest pain for a while, seemingly it was worse today, though his history provision is vague.  No syncope, no current other complaints.  Review of Systems  Positive: Chest pain Negative: Syncope, fever  Physical Exam  BP 112/73   Pulse 81   Temp 98.6 F (37 C) (Oral)   Resp 16   Ht 5' 9.5" (1.765 m)   Wt 80.7 kg   SpO2 97%   BMI 25.90 kg/m  Gen:   Awake, no distress speaking clearly Resp:  Normal effort no increased work of breathing MSK:   Moves extremities without difficulty no deformity Other:  Neuro grossly intact  Medical Decision Making  Medically screening exam initiated at 12:50 PM.  Appropriate orders placed.  JAKAREE DAMASCO was informed that the remainder of the evaluation will be completed by another provider, this initial triage assessment does not replace that evaluation, and the importance of remaining in the ED until their evaluation is complete.   Gerhard Munch, MD 08/26/23 1250

## 2023-08-26 NOTE — ED Provider Notes (Signed)
[place] EMERGENCY DEPARTMENT AT [facility] Provider Note   CSN: [id number] Arrival date & time: 08/26/23  1208     History  Chief Complaint  Patient presents with   Chest Pain    Dylan Frank is a 37 y.o. male.  Patient is a 37 yo male presenting for chest pain. Patient admits to left sided chest pain that is described as sharp, moderate, non radiating, and constant. Provoked by lifting, moving, or pressure. Denies fever, chills, or coughing. No lower extremity swelling. Unloads trucks for a living.   The history is provided by the patient. No language interpreter was used.  Chest Pain Associated symptoms: no abdominal pain, no back pain, no cough, no fever, no palpitations, no shortness of breath and no vomiting        Home Medications Prior to Admission medications   Medication Sig Start Date End Date Taking? Authorizing Provider  ibuprofen (ADVIL) 600 MG tablet Take 1 tablet (600 mg total) by mouth every 6 (six) hours as needed for mild pain. 08/26/23  Yes Edwin Dada P, DO  lidocaine (LIDODERM) 5 % Place 1 patch onto the skin daily. Remove & Discard patch within 12 hours or as directed by MD 08/26/23  Yes Edwin Dada P, DO  divalproex (DEPAKOTE ER) 500 MG 24 hr tablet Take 500 mg by mouth every evening.     [provider]  lisinopril (PRINIVIL,ZESTRIL) 10 MG tablet TAKE 1 TABLET BY MOUTH DAILY 08/05/17   Nestor Ramp, MD  methylphenidate (CONCERTA) 18 MG CR tablet Take 18 mg by mouth every morning.    [provider]  ondansetron (ZOFRAN-ODT) 4 MG disintegrating tablet 4mg  ODT q4 hours prn nausea/vomit 08/05/22   Charlynne Pander, MD  QUEtiapine (SEROQUEL) 400 MG tablet Take 400 mg by mouth at bedtime.     [provider]  albuterol (PROVENTIL HFA;VENTOLIN HFA) 108 (90 BASE) MCG/ACT inhaler Inhale 2 puffs into the lungs every 6 (six) hours as needed for wheezing. Patient not taking: Reported on 04/09/2017 03/02/14 04/28/20  Nestor Ramp, MD   fluticasone [facility]) 50 MCG/ACT nasal spray Place 1 spray into both nostrils daily. 12/23/13 04/28/20  Nestor Ramp, MD      Allergies    Mushroom extract complex    Review of Systems   Review of Systems  Constitutional:  Negative for chills and fever.  HENT:  Negative for ear pain and sore throat.   Eyes:  Negative for pain and visual disturbance.  Respiratory:  Negative for cough and shortness of breath.   Cardiovascular:  Positive for chest pain. Negative for palpitations.  Gastrointestinal:  Negative for abdominal pain and vomiting.  Genitourinary:  Negative for dysuria and hematuria.  Musculoskeletal:  Negative for arthralgias and back pain.  Skin:  Negative for color change and rash.  Neurological:  Negative for seizures and syncope.  All other systems reviewed and are negative.   Physical Exam Updated Vital Signs BP 129/84   Pulse 80   Temp 97.7 F (36.5 C) (Oral)   Resp 16   Ht 5' 9.5" (1.765 m)   Wt 80.7 kg   SpO2 100%   BMI 25.90 kg/m  Physical Exam Vitals and nursing note reviewed.  Constitutional:      General: He is not in acute distress.    Appearance: He is well-developed.  HENT:     Head: Normocephalic and atraumatic.  Eyes:     Conjunctiva/sclera: Conjunctivae normal.  Cardiovascular:  Rate and Rhythm: Normal rate and regular rhythm.     Heart sounds: No murmur heard. Pulmonary:     Effort: Pulmonary effort is normal. No respiratory distress.     Breath sounds: Normal breath sounds.  Chest:       Comments: Reproducible left chest wall tenderness.  Abdominal:     Palpations: Abdomen is soft.     Tenderness: There is no abdominal tenderness.  Musculoskeletal:        General: No swelling.     Cervical back: Neck supple.  Skin:    General: Skin is warm and dry.     Capillary Refill: Capillary refill takes less than 2 seconds.  Neurological:     Mental Status: He is alert.  Psychiatric:        Mood and Affect: Mood normal.     ED  Results / Procedures / Treatments   Labs (all labs ordered are listed, but only abnormal results are displayed) Labs Reviewed  COMPREHENSIVE METABOLIC PANEL - Abnormal; Notable for the following components:      Result Value   Potassium 3.4 (*)    Calcium 8.8 (*)    All other components within normal limits  CBC WITH DIFFERENTIAL/PLATELET - Abnormal; Notable for the following components:   Hemoglobin 12.8 (*)    RDW 11.2 (*)    All other components within normal limits  LIPASE, BLOOD  MAGNESIUM  TROPONIN I (HIGH SENSITIVITY)    EKG EKG Interpretation Date/Time:  Monday August 26 2023 12:15:32 EDT Ventricular Rate:  69 PR Interval:  136 QRS Duration:  94 QT Interval:  362 QTC Calculation: 387 R Axis:   24  Text Interpretation: Normal sinus rhythm Cannot rule out Anterior infarct , age undetermined Abnormal ECG When compared with ECG of 11-Mar-2022 20:31, PREVIOUS ECG IS PRESENT Confirmed by Edwin Dada ([id number]) on 08/26/2023 4:53:04 PM  Radiology No results found.  Procedures Procedures    Medications Ordered in ED Medications  ketorolac (TORADOL) 30 MG/ML injection 30 mg (30 mg Intramuscular Given 08/26/23 1825)  acetaminophen (TYLENOL) tablet 650 mg (650 mg Oral Given 08/26/23 1825)    ED Course/ Medical Decision Making/ A&P                                 Medical Decision Making Amount and/or Complexity of Data Reviewed Radiology: ordered.  Risk OTC drugs. Prescription drug management.   12:39 AM 37 yo male presenting for chest pain.    The patient's chest pain is not suggestive of pulmonary embolus, cardiac ischemia, aortic dissection, pericarditis, myocarditis, pulmonary embolism, pneumothorax, pneumonia, Zoster, or esophageal perforation, or other serious etiology.  Historically not abrupt in onset, tearing or ripping, pulses symmetric. EKG nonspecific for ischemia/infarction. No dysrhythmias, brugada, WPW, prolonged QT noted. [CXR reviewed and WNL]  troponin negative x 1.  Do not need to collect second troponin.  Patient has had pain for 1 month.  CXR reviewed. Labs without demonstration of acute pathology unless otherwise noted above.   Low HEART Score: 0-3 points (0.9-1.7% risk of MACE).] Given the extremely low risk of these diagnoses further testing and evaluation for these possibilities does not appear to be indicated at this time.   Pain likely secondary to costochondritis because of the reproducible nature.  Recommend Motrin, lighted cane patches, and no heavy lifting.  Patient in no distress and overall condition improved here in the ED. Detailed discussions were had with  the patient regarding current findings, and need for close f/u with PCP or on call doctor. The patient has been instructed to return immediately if the symptoms worsen in any way for re-evaluation. Patient verbalized understanding and is in agreement with current care plan. All questions answered prior to discharge.         Final Clinical Impression(s) / ED Diagnoses Final diagnoses:  Costochondritis    Rx / DC Orders ED Discharge Orders          Ordered    ibuprofen (ADVIL) 600 MG tablet  Every 6 hours PRN        08/26/23 1825    lidocaine (LIDODERM) 5 %  Every 24 hours        08/26/23 1825              Franne Forts, DO 09/01/23 [phone]

## 2023-08-26 NOTE — ED Triage Notes (Signed)
Pt arrived via [facility] from work for c/o non radiating left sided chest painx4d. Pt states the chest pain is worse w/exertion. EMS gave ASA 324 mg and nitrox1. Per EMS, pt told them the chest pain resolved with the nitroglycerin.

## 2023-08-26 NOTE — Discharge Instructions (Signed)
Today you were diagnosed with costochondritis.  Please rest, avoid heavy lifting, take Motrin 600 mg every 6 hours as needed for pain.  Place lidocaine patches in the area where pain is worse.

## 2023-09-02 ENCOUNTER — Telehealth: Payer: Self-pay | Admitting: *Deleted

## 2023-09-02 NOTE — Progress Notes (Signed)
Care Coordination  Outreach Note  09/02/2023 Name: Dylan Frank MRN: [id number] DOB: Aug 28, 1986   Care Coordination Outreach Attempts: An unsuccessful telephone outreach was attempted today to offer the patient information about available care coordination services.  Follow Up Plan:  Additional outreach attempts will be made to offer the patient care coordination information and services.   Encounter Outcome:  No Answer   Gwenevere Ghazi  Care Coordination Care Guide  Direct Dial: [phone]

## 2023-09-11 NOTE — Progress Notes (Unsigned)
Care Coordination  Outreach Note  09/11/2023 Name: Dylan Frank MRN: [id number] DOB: 06-06-1986   Care Coordination Outreach Attempts: A second unsuccessful outreach was attempted today to offer the patient with information about available care coordination services.  Follow Up Plan:  Additional outreach attempts will be made to offer the patient care coordination information and services.   Encounter Outcome:  No Answer  Gwenevere Ghazi  Care Coordination Care Guide  Direct Dial: [phone]

## 2023-09-12 NOTE — Progress Notes (Signed)
Care Coordination  Outreach Note  09/12/2023 Name: GEOFFERY STOLARSKI MRN: [id number] DOB: 1986/07/18   Care Coordination Outreach Attempts: A third unsuccessful outreach was attempted today to offer the patient with information about available care coordination services.  Follow Up Plan:  No further outreach attempts will be made at this time. We have been unable to contact the patient to offer or enroll patient in care coordination services  Encounter Outcome:  No Answer  Gwenevere Ghazi  Care Coordination Care Guide  Direct Dial: [phone]

## 2023-09-17 ENCOUNTER — Observation Stay (HOSPITAL_COMMUNITY)
Admission: EM | Admit: 2023-09-17 | Discharge: 2023-09-19 | Disposition: A | Discharge status: Home / Self Care | Payer: 59 | Attending: Internal Medicine | Admitting: Internal Medicine

## 2023-09-17 ENCOUNTER — Observation Stay (HOSPITAL_COMMUNITY): Payer: 59

## 2023-09-17 ENCOUNTER — Encounter (HOSPITAL_COMMUNITY): Payer: Self-pay | Admitting: Emergency Medicine

## 2023-09-17 ENCOUNTER — Emergency Department (HOSPITAL_COMMUNITY): Payer: 59

## 2023-09-17 DIAGNOSIS — G9340 Encephalopathy, unspecified: Secondary | ICD-10-CM | POA: Diagnosis not present

## 2023-09-17 DIAGNOSIS — R1111 Vomiting without nausea: Secondary | ICD-10-CM | POA: Diagnosis not present

## 2023-09-17 DIAGNOSIS — R0689 Other abnormalities of breathing: Secondary | ICD-10-CM | POA: Diagnosis not present

## 2023-09-17 DIAGNOSIS — J45901 Unspecified asthma with (acute) exacerbation: Principal | ICD-10-CM | POA: Diagnosis present

## 2023-09-17 DIAGNOSIS — Z79899 Other long term (current) drug therapy: Secondary | ICD-10-CM | POA: Diagnosis not present

## 2023-09-17 DIAGNOSIS — E876 Hypokalemia: Secondary | ICD-10-CM

## 2023-09-17 DIAGNOSIS — R0602 Shortness of breath: Secondary | ICD-10-CM | POA: Diagnosis not present

## 2023-09-17 DIAGNOSIS — Z1152 Encounter for screening for COVID-19: Secondary | ICD-10-CM | POA: Diagnosis not present

## 2023-09-17 DIAGNOSIS — I10 Essential (primary) hypertension: Secondary | ICD-10-CM | POA: Insufficient documentation

## 2023-09-17 DIAGNOSIS — G9341 Metabolic encephalopathy: Secondary | ICD-10-CM

## 2023-09-17 DIAGNOSIS — F840 Autistic disorder: Secondary | ICD-10-CM | POA: Insufficient documentation

## 2023-09-17 DIAGNOSIS — R069 Unspecified abnormalities of breathing: Secondary | ICD-10-CM | POA: Diagnosis not present

## 2023-09-17 DIAGNOSIS — Z743 Need for continuous supervision: Secondary | ICD-10-CM | POA: Diagnosis not present

## 2023-09-17 LAB — CBC WITH DIFFERENTIAL/PLATELET
Abs Immature Granulocytes: 0.02 10*3/uL (ref 0.00–0.07)
Basophils Absolute: 0 10*3/uL (ref 0.0–0.1)
Basophils Relative: 1 %
Eosinophils Absolute: 0.2 10*3/uL (ref 0.0–0.5)
Eosinophils Relative: 3 %
HCT: 42.4 % (ref 39.0–52.0)
Hemoglobin: 13.7 g/dL (ref 13.0–17.0)
Immature Granulocytes: 0 %
Lymphocytes Relative: 28 %
Lymphs Abs: 1.8 10*3/uL (ref 0.7–4.0)
MCH: 30.8 pg (ref 26.0–34.0)
MCHC: 32.3 g/dL (ref 30.0–36.0)
MCV: 95.3 fL (ref 80.0–100.0)
Monocytes Absolute: 0.9 10*3/uL (ref 0.1–1.0)
Monocytes Relative: 15 %
Neutro Abs: 3.4 10*3/uL (ref 1.7–7.7)
Neutrophils Relative %: 53 %
Platelets: 226 10*3/uL (ref 150–400)
RBC: 4.45 MIL/uL (ref 4.22–5.81)
RDW: 11.5 % (ref 11.5–15.5)
WBC: 6.4 10*3/uL (ref 4.0–10.5)
nRBC: 0 % (ref 0.0–0.2)

## 2023-09-17 LAB — RESP PANEL BY RT-PCR (RSV, FLU A&B, COVID)  RVPGX2
Influenza A by PCR: NEGATIVE
Influenza B by PCR: NEGATIVE
Resp Syncytial Virus by PCR: NEGATIVE
SARS Coronavirus 2 by RT PCR: NEGATIVE

## 2023-09-17 LAB — BLOOD GAS, VENOUS
Acid-base deficit: 0.2 mmol/L (ref 0.0–2.0)
Bicarbonate: 26.4 mmol/L (ref 20.0–28.0)
O2 Saturation: 80.4 %
Patient temperature: 37
pCO2, Ven: 50 mm[Hg] (ref 44–60)
pH, Ven: 7.33 (ref 7.25–7.43)
pO2, Ven: 51 mm[Hg] — ABNORMAL HIGH (ref 32–45)

## 2023-09-17 LAB — BRAIN NATRIURETIC PEPTIDE: B Natriuretic Peptide: 11.7 pg/mL (ref 0.0–100.0)

## 2023-09-17 LAB — BASIC METABOLIC PANEL
Anion gap: 8 (ref 5–15)
BUN: 6 mg/dL (ref 6–20)
CO2: 25 mmol/L (ref 22–32)
Calcium: 8.7 mg/dL — ABNORMAL LOW (ref 8.9–10.3)
Chloride: 103 mmol/L (ref 98–111)
Creatinine, Ser: 0.98 mg/dL (ref 0.61–1.24)
GFR, Estimated: >60 mL/min (ref >60)
Glucose, Bld: 96 mg/dL (ref 70–99)
Potassium: 2.9 mmol/L — ABNORMAL LOW (ref 3.5–5.1)
Sodium: 136 mmol/L (ref 135–145)

## 2023-09-17 LAB — CBG MONITORING, ED: Glucose-Capillary: 113 mg/dL — ABNORMAL HIGH (ref 70–99)

## 2023-09-17 MED ORDER — ONDANSETRON HCL 4 MG PO TABS: 4.0000 mg | ORAL_TABLET | Freq: Four times a day (QID) | ORAL | Status: DC | PRN

## 2023-09-17 MED ORDER — POTASSIUM CHLORIDE 10 MEQ/100ML IV SOLN
10.0000 meq | INTRAVENOUS | Status: AC
  Administered 2023-09-17 (×2): 10 meq via INTRAVENOUS
  Filled 2023-09-17 (×2): qty 100

## 2023-09-17 MED ORDER — SODIUM CHLORIDE 0.9 % IV SOLN
500.0000 mg | Freq: Every day | INTRAVENOUS | Status: DC
  Administered 2023-09-17: 500 mg via INTRAVENOUS
  Filled 2023-09-17: qty 5

## 2023-09-17 MED ORDER — ALBUTEROL SULFATE (2.5 MG/3ML) 0.083% IN NEBU
2.5000 mg | INHALATION_SOLUTION | RESPIRATORY_TRACT | Status: DC | PRN
  Administered 2023-09-18: 2.5 mg via RESPIRATORY_TRACT
  Filled 2023-09-17: qty 3

## 2023-09-17 MED ORDER — LEVALBUTEROL HCL 0.63 MG/3ML IN NEBU
0.6300 mg | INHALATION_SOLUTION | Freq: Four times a day (QID) | RESPIRATORY_TRACT | Status: DC
  Administered 2023-09-17: 0.63 mg via RESPIRATORY_TRACT
  Filled 2023-09-17: qty 3

## 2023-09-17 MED ORDER — ACETAMINOPHEN 325 MG PO TABS: 650.0000 mg | ORAL_TABLET | Freq: Four times a day (QID) | ORAL | Status: DC | PRN

## 2023-09-17 MED ORDER — SODIUM CHLORIDE 0.9 % IV SOLN: INTRAVENOUS | Status: DC

## 2023-09-17 MED ORDER — IPRATROPIUM-ALBUTEROL 0.5-2.5 (3) MG/3ML IN SOLN
3.0000 mL | RESPIRATORY_TRACT | Status: DC
  Administered 2023-09-17: 3 mL via RESPIRATORY_TRACT
  Filled 2023-09-17: qty 3

## 2023-09-17 MED ORDER — ACETAMINOPHEN 650 MG RE SUPP: 650.0000 mg | Freq: Four times a day (QID) | RECTAL | Status: DC | PRN

## 2023-09-17 MED ORDER — ONDANSETRON HCL 4 MG/2ML IJ SOLN: 4.0000 mg | Freq: Four times a day (QID) | INTRAMUSCULAR | Status: DC | PRN

## 2023-09-17 MED ORDER — PANTOPRAZOLE SODIUM 40 MG PO TBEC
40.0000 mg | DELAYED_RELEASE_TABLET | Freq: Every day | ORAL | Status: DC
  Administered 2023-09-17 – 2023-09-19 (×3): 40 mg via ORAL
  Filled 2023-09-17 (×3): qty 1

## 2023-09-17 MED ORDER — SODIUM CHLORIDE 0.9% FLUSH
3.0000 mL | Freq: Two times a day (BID) | INTRAVENOUS | Status: DC
  Administered 2023-09-18 (×2): 3 mL via INTRAVENOUS

## 2023-09-17 MED ORDER — POTASSIUM CHLORIDE 10 MEQ/100ML IV SOLN
10.0000 meq | INTRAVENOUS | Status: DC
  Administered 2023-09-18: 10 meq via INTRAVENOUS
  Filled 2023-09-17: qty 100

## 2023-09-17 MED ORDER — ENOXAPARIN SODIUM 40 MG/0.4ML IJ SOSY
40.0000 mg | PREFILLED_SYRINGE | INTRAMUSCULAR | Status: DC
  Administered 2023-09-18 – 2023-09-19 (×2): 40 mg via SUBCUTANEOUS
  Filled 2023-09-17 (×2): qty 0.4

## 2023-09-17 MED ORDER — ALBUTEROL SULFATE (2.5 MG/3ML) 0.083% IN NEBU
2.5000 mg | INHALATION_SOLUTION | Freq: Once | RESPIRATORY_TRACT | Status: AC
  Administered 2023-09-17: 2.5 mg via RESPIRATORY_TRACT
  Filled 2023-09-17: qty 3

## 2023-09-17 MED ORDER — METHYLPREDNISOLONE SODIUM SUCC 40 MG IJ SOLR
40.0000 mg | Freq: Two times a day (BID) | INTRAMUSCULAR | Status: DC
  Administered 2023-09-17 – 2023-09-18 (×2): 40 mg via INTRAVENOUS
  Filled 2023-09-17 (×2): qty 1

## 2023-09-17 MED ORDER — MAGNESIUM SULFATE 2 GM/50ML IV SOLN
2.0000 g | Freq: Once | INTRAVENOUS | Status: AC
  Administered 2023-09-17: 2 g via INTRAVENOUS
  Filled 2023-09-17: qty 50
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
## 2023-09-17 NOTE — Progress Notes (Signed)
Peak Flow 260 with good effort and 3 attempts.
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
## 2023-09-17 NOTE — ED Notes (Signed)
Call to RT regarding peak flow order

## 2023-09-18 ENCOUNTER — Other Ambulatory Visit: Payer: Self-pay

## 2023-09-18 DIAGNOSIS — J45901 Unspecified asthma with (acute) exacerbation: Secondary | ICD-10-CM | POA: Diagnosis not present

## 2023-09-18 LAB — COMPREHENSIVE METABOLIC PANEL
ALT: 10 U/L (ref 0–44)
AST: 23 U/L (ref 15–41)
Albumin: 3.5 g/dL (ref 3.5–5.0)
Alkaline Phosphatase: 61 U/L (ref 38–126)
Anion gap: 12 (ref 5–15)
BUN: 7 mg/dL (ref 6–20)
CO2: 21 mmol/L — ABNORMAL LOW (ref 22–32)
Calcium: 8.5 mg/dL — ABNORMAL LOW (ref 8.9–10.3)
Chloride: 104 mmol/L (ref 98–111)
Creatinine, Ser: 0.94 mg/dL (ref 0.61–1.24)
GFR, Estimated: >60 mL/min (ref >60)
Glucose, Bld: 206 mg/dL — ABNORMAL HIGH (ref 70–99)
Potassium: 3.2 mmol/L — ABNORMAL LOW (ref 3.5–5.1)
Sodium: 137 mmol/L (ref 135–145)
Total Bilirubin: 0.3 mg/dL (ref 0.3–1.2)
Total Protein: 7.2 g/dL (ref 6.5–8.1)

## 2023-09-18 LAB — RAPID URINE DRUG SCREEN, HOSP PERFORMED
Amphetamines: NOT DETECTED
Barbiturates: NOT DETECTED
Benzodiazepines: NOT DETECTED
Cocaine: NOT DETECTED
Opiates: NOT DETECTED
Tetrahydrocannabinol: POSITIVE — AB

## 2023-09-18 LAB — CBC
HCT: 37.4 % — ABNORMAL LOW (ref 39.0–52.0)
Hemoglobin: 11.9 g/dL — ABNORMAL LOW (ref 13.0–17.0)
MCH: 30.7 pg (ref 26.0–34.0)
MCHC: 31.8 g/dL (ref 30.0–36.0)
MCV: 96.4 fL (ref 80.0–100.0)
Platelets: 239 10*3/uL (ref 150–400)
RBC: 3.88 MIL/uL — ABNORMAL LOW (ref 4.22–5.81)
RDW: 11.5 % (ref 11.5–15.5)
WBC: 7.5 10*3/uL (ref 4.0–10.5)
nRBC: 0 % (ref 0.0–0.2)

## 2023-09-18 LAB — PHOSPHORUS: Phosphorus: 2.8 mg/dL (ref 2.5–4.6)

## 2023-09-18 LAB — HIV ANTIBODY (ROUTINE TESTING W REFLEX): HIV Screen 4th Generation wRfx: NONREACTIVE

## 2023-09-18 LAB — MAGNESIUM: Magnesium: 2 mg/dL (ref 1.7–2.4)

## 2023-09-18 LAB — AMMONIA: Ammonia: 16 umol/L (ref 9–35)

## 2023-09-18 MED ORDER — POTASSIUM CHLORIDE CRYS ER 20 MEQ PO TBCR
40.0000 meq | EXTENDED_RELEASE_TABLET | ORAL | Status: DC
  Administered 2023-09-18: 40 meq via ORAL
  Filled 2023-09-18: qty 2

## 2023-09-18 MED ORDER — POTASSIUM CHLORIDE CRYS ER 20 MEQ PO TBCR: 40.0000 meq | EXTENDED_RELEASE_TABLET | Freq: Once | ORAL | Status: DC

## 2023-09-18 MED ORDER — ALBUTEROL SULFATE (2.5 MG/3ML) 0.083% IN NEBU
2.5000 mg | INHALATION_SOLUTION | Freq: Two times a day (BID) | RESPIRATORY_TRACT | Status: DC
  Administered 2023-09-18: 2.5 mg via RESPIRATORY_TRACT
  Filled 2023-09-18: qty 3

## 2023-09-18 MED ORDER — POTASSIUM CHLORIDE 20 MEQ PO PACK
60.0000 meq | PACK | Freq: Once | ORAL | Status: AC
  Administered 2023-09-18: 60 meq via ORAL
  Filled 2023-09-18: qty 3

## 2023-09-18 MED ORDER — IPRATROPIUM-ALBUTEROL 0.5-2.5 (3) MG/3ML IN SOLN
3.0000 mL | Freq: Two times a day (BID) | RESPIRATORY_TRACT | Status: DC
  Administered 2023-09-18: 3 mL via RESPIRATORY_TRACT
  Filled 2023-09-18: qty 3

## 2023-09-18 MED ORDER — ALBUTEROL SULFATE (2.5 MG/3ML) 0.083% IN NEBU
2.5000 mg | INHALATION_SOLUTION | RESPIRATORY_TRACT | Status: DC
  Administered 2023-09-18 – 2023-09-19 (×3): 2.5 mg via RESPIRATORY_TRACT
  Filled 2023-09-18 (×3): qty 3

## 2023-09-18 MED ORDER — PREDNISONE 50 MG PO TABS
50.0000 mg | ORAL_TABLET | Freq: Every day | ORAL | Status: DC
  Administered 2023-09-19: 50 mg via ORAL
  Filled 2023-09-18: qty 1

## 2023-09-18 NOTE — Progress Notes (Addendum)
SATURATION QUALIFICATIONS: (This note is used to comply with regulatory documentation for home oxygen)  Patient Saturations on Room Air at Rest = 97%  Pt Saturation on Room Air while Ambulating =97%  Pt c/o of weakness while ambulating. Saturation and pulse remained stable.

## 2023-09-18 NOTE — Plan of Care (Signed)
  Problem: Education: Goal: Knowledge of General Education information will improve Description Including pain rating scale, medication(s)/side effects and non-pharmacologic comfort measures Outcome: Progressing   

## 2023-09-18 NOTE — Plan of Care (Signed)
  Problem: Clinical Measurements: Goal: Will remain free from infection Outcome: Progressing Goal: Diagnostic test results will improve Outcome: Progressing   Problem: Activity: Goal: Risk for activity intolerance will decrease Outcome: Progressing   

## 2023-09-18 NOTE — Progress Notes (Signed)
[facility] Progress Note Patient: Dylan Frank GNF:[id number] DOB: 29-Apr-1986 DOA: 09/17/2023  DOS: the patient was seen and examined on 09/18/2023  Brief hospital course: PMH of HTN, anxiety, asthma.  Presented to hospital with numbness or shortness of breath.  Currently being treated for asthma exacerbation. Assessment and Plan: Asthma exacerbation. COVID-negative.  Flu negative. Chest x-ray negative. Oxygenation improving.  Currently on room air on exertion. Still has tight lung examination as well as tachypnea and dizziness. Will monitor overnight on nebulizer therapy. Switch to p.o. prednisone.  Hypokalemia. In the setting of albuterol use but Monitor.  Encephalopathy. Cannabis use. Etiology not clear but CT Head Unremarkable. Has Some Dizziness. UDS Positive for Cannabis. For Now We Will Monitor.   Subjective: Continues to have shortness of breath.  Reports dizziness.  Oral intake.  Physical Exam: General: in moderate distress, No Rash Cardiovascular: S1 and S2 Present, No Murmur Respiratory: Increased respiratory effort, Bilateral Air entry present. No Crackles, bilateral wheezes Abdomen: Bowel Sound present, No tenderness Extremities: No edema Neuro: Alert and oriented x3, no new focal deficit  Data Reviewed: I have Reviewed nursing notes, Vitals, and Lab results. Since last encounter, pertinent lab results CBC and BMP   . I have ordered test including CBC and BMP  .   Disposition: Status is: Observation  enoxaparin (LOVENOX) injection 40 mg Start: 09/18/23 1000 SCDs Start: 09/17/23 2219   Family Communication: No one at bedside Level of care: Telemetry   Vitals:   09/18/23 0750 09/18/23 0833 09/18/23 1044 09/18/23 1231  BP: 112/74 120/78  125/81  Pulse: 67   81  Resp: 20 20  20   Temp: 98.3 F (36.8 C) 98.6 F (37 C)  98.2 F (36.8 C)  TempSrc: Oral Oral  Oral  SpO2: 97% 98% 97% 94%  Weight:         Author: Lynden Oxford, MD 09/18/2023 8:37  PM  Please look on [email or web address] to find out who is on call.

## 2023-09-18 NOTE — Care Management Obs Status (Signed)
MEDICARE OBSERVATION STATUS NOTIFICATION   Patient Details  Name: Dylan Frank MRN: [id number] Date of Birth: Oct 17, 1986   Medicare Observation Status Notification Given:  Yes    Larrie Kass, LCSW 09/18/2023, 10:29 AM

## 2023-09-18 NOTE — Hospital Course (Signed)
Brief hospital course: PMH of HTN, anxiety, asthma.  Presented to hospital with numbness or shortness of breath.  Currently being treated for asthma exacerbation. Assessment and Plan: Asthma exacerbation. COVID-negative.  Flu negative. Chest x-ray negative. Oxygenation improving.  Currently on room air on exertion. Still has tight lung examination as well as tachypnea and dizziness. Will monitor overnight on nebulizer therapy. Switch to p.o. prednisone.  Hypokalemia. In the setting of albuterol use but Monitor.  Encephalopathy. Cannabis use. Etiology not clear but CT Head Unremarkable. Has Some Dizziness. UDS Positive for Cannabis. For Now We Will Monitor.
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
## 2023-09-19 ENCOUNTER — Other Ambulatory Visit (HOSPITAL_COMMUNITY): Payer: Self-pay

## 2023-09-19 ENCOUNTER — Other Ambulatory Visit: Payer: Self-pay

## 2023-09-19 DIAGNOSIS — J45901 Unspecified asthma with (acute) exacerbation: Secondary | ICD-10-CM | POA: Diagnosis not present

## 2023-09-19 LAB — CBC
HCT: 37.5 % — ABNORMAL LOW (ref 39.0–52.0)
Hemoglobin: 12 g/dL — ABNORMAL LOW (ref 13.0–17.0)
MCH: 30.8 pg (ref 26.0–34.0)
MCHC: 32 g/dL (ref 30.0–36.0)
MCV: 96.4 fL (ref 80.0–100.0)
Platelets: 249 10*3/uL (ref 150–400)
RBC: 3.89 MIL/uL — ABNORMAL LOW (ref 4.22–5.81)
RDW: 11.5 % (ref 11.5–15.5)
WBC: 17 10*3/uL — ABNORMAL HIGH (ref 4.0–10.5)
nRBC: 0 % (ref 0.0–0.2)

## 2023-09-19 LAB — COMPREHENSIVE METABOLIC PANEL
ALT: 10 U/L (ref 0–44)
AST: 17 U/L (ref 15–41)
Albumin: 3.4 g/dL — ABNORMAL LOW (ref 3.5–5.0)
Alkaline Phosphatase: 58 U/L (ref 38–126)
Anion gap: 11 (ref 5–15)
BUN: 9 mg/dL (ref 6–20)
CO2: 24 mmol/L (ref 22–32)
Calcium: 9.3 mg/dL (ref 8.9–10.3)
Chloride: 106 mmol/L (ref 98–111)
Creatinine, Ser: 0.97 mg/dL (ref 0.61–1.24)
GFR, Estimated: >60 mL/min (ref >60)
Glucose, Bld: 102 mg/dL — ABNORMAL HIGH (ref 70–99)
Potassium: 3.7 mmol/L (ref 3.5–5.1)
Sodium: 141 mmol/L (ref 135–145)
Total Bilirubin: 0.4 mg/dL (ref 0.3–1.2)
Total Protein: 7.3 g/dL (ref 6.5–8.1)

## 2023-09-19 MED ORDER — PREDNISONE 10 MG PO TABS
ORAL_TABLET | ORAL | 0 refills | Status: DC
  Filled 2023-09-19: qty 10, 4d supply, fill #0

## 2023-09-19 MED ORDER — ALBUTEROL SULFATE HFA 108 (90 BASE) MCG/ACT IN AERS
2.0000 | INHALATION_SPRAY | Freq: Four times a day (QID) | RESPIRATORY_TRACT | 2 refills | Status: DC | PRN
  Filled 2023-09-19: qty 6.7, 25d supply, fill #0

## 2023-09-19 MED ORDER — MOMETASONE FURO-FORMOTEROL FUM 100-5 MCG/ACT IN AERO
2.0000 | INHALATION_SPRAY | Freq: Two times a day (BID) | RESPIRATORY_TRACT | 0 refills | Status: DC
  Filled 2023-09-19: qty 13, 30d supply, fill #0

## 2023-09-19 NOTE — Plan of Care (Signed)

## 2023-09-19 NOTE — TOC Transition Note (Signed)
Transition of Care [facility]) - CM/SW Discharge Note   Patient Details  Name: Dylan Frank MRN: [id number] Date of Birth: [birth year]  Transition of Care [facility]) CM/SW Contact:  Larrie Kass, LCSW Phone Number: 09/19/2023, 9:14 AM   Clinical Narrative:    Safe Transport was arranged to pick pt up upon d/c. They will call nurse station when they arrive. Pt's RN was made aware, no further TOC needs TOC sign off.    Final next level of care: Home/Self Care Barriers to Discharge: No Barriers Identified   Patient Goals and CMS Choice      Discharge Placement                      Patient and family notified of of transfer: 09/19/23  Discharge Plan and Services Additional resources added to the After Visit Summary for   In-house Referral: Clinical Social Work                                   Social Determinants of Health (SDOH) Interventions SDOH Screenings   Food Insecurity: No Food Insecurity (09/18/2023)  Housing: Patient Declined (09/18/2023)  Transportation Needs: No Transportation Needs (09/18/2023)  Utilities: Not At Risk (09/18/2023)  Tobacco Use: Low Risk  (09/17/2023)     Readmission Risk Interventions     No data to display

## 2023-09-20 ENCOUNTER — Telehealth: Payer: Self-pay

## 2023-09-20 NOTE — Transitions of Care (Post Inpatient/ED Visit) (Unsigned)
09/20/2023  Name: Dylan Frank MRN: [id number] DOB: 1986-08-25  Today's TOC FU Call Status: Today's TOC FU Call Status:: Unsuccessful Call (1st Attempt) Unsuccessful Call (1st Attempt) Date: 09/20/23  Attempted to reach the patient regarding the most recent Inpatient/ED visit.  Follow Up Plan: Additional outreach attempts will be made to reach the patient to complete the Transitions of Care (Post Inpatient/ED visit) call.   Signature Karena Addison, LPN [facility] Nurse Health Advisor Direct Dial [phone]

## 2023-09-24 NOTE — Transitions of Care (Post Inpatient/ED Visit) (Unsigned)
09/24/2023  Name: Dylan Frank MRN: [id number] DOB: 07-Mar-1986  Today's TOC FU Call Status: Today's TOC FU Call Status:: Unsuccessful Call (2nd Attempt) Unsuccessful Call (1st Attempt) Date: 09/20/23 Unsuccessful Call (2nd Attempt) Date: 09/24/23  Attempted to reach the patient regarding the most recent Inpatient/ED visit.  Follow Up Plan: Additional outreach attempts will be made to reach the patient to complete the Transitions of Care (Post Inpatient/ED visit) call.   Signature Karena Addison, LPN [facility] Nurse Health Advisor Direct Dial [phone]
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
## 2023-09-25 NOTE — Transitions of Care (Post Inpatient/ED Visit) (Signed)
09/25/2023  Name: [name] MRN: [id number] DOB: 21-Dec-1985  Today's TOC FU Call Status: Today's TOC FU Call Status:: Unsuccessful Call (3rd Attempt) Unsuccessful Call (1st Attempt) Date: 09/20/23 Unsuccessful Call (2nd Attempt) Date: 09/24/23 Unsuccessful Call (3rd Attempt) Date: 09/25/23  Attempted to reach the patient regarding the most recent Inpatient/ED visit.  Follow Up Plan: No further outreach attempts will be made at this time. We have been unable to contact the patient.  Signature Karena Addison, LPN [facility] Nurse Health Advisor Direct Dial [phone]

## 2023-09-30 LAB — THC,MS,WB/SP RFX
Cannabidiol: NEGATIVE ng/mL
Cannabinoid Confirmation: POSITIVE
Carboxy-THC: 18.1 ng/mL
Hydroxy-THC: 1.7 ng/mL
Tetrahydrocannabinol(THC): 1.6 ng/mL

## 2023-09-30 LAB — DRUG SCREEN 10 W/CONF, SERUM
Amphetamines, IA: NEGATIVE ng/mL
Barbiturates, IA: NEGATIVE ug/mL
Benzodiazepines, IA: NEGATIVE ng/mL
Cocaine & Metabolite, IA: NEGATIVE ng/mL
Methadone, IA: NEGATIVE ng/mL
Opiates, IA: NEGATIVE ng/mL
Oxycodones, IA: NEGATIVE ng/mL
Phencyclidine, IA: NEGATIVE ng/mL
Propoxyphene, IA: NEGATIVE ng/mL
THC(Marijuana) Metabolite, IA: POSITIVE ng/mL — AB

## 2024-05-04 ENCOUNTER — Telehealth: Payer: Self-pay

## 2024-05-04 NOTE — Telephone Encounter (Signed)
 He will have to schedule an appointment with me. I doubt we can get it done by that time. He should maybe go to urgent care unless he can get extension.Has he seen any other doctors in that time period that he can go to? Ok to schedule him for my next available THANKS! Violetta Grice

## 2024-05-04 NOTE — Telephone Encounter (Signed)
 Patient calls nurse line regarding SSI form completion.   Advised patient that he has not been seen in our office since 2018, therefore, is technically no longer a patient.   Advised that I would send message to Dr. Andree Kayser, as she is still listed as PCP to determine if patient could still be seen in our office.   Patient reports that paperwork is due on 05/07/24. Recommended that patient see if this due date could be extended.   Please advise.   Elsie Halo, RN

## 2024-05-05 NOTE — Telephone Encounter (Signed)
 Attempted to call patient to schedule an appointment with Dr. Andree Kayser or recommend him to be seen at urgent care for SSI form to be completed.  Patient didn't answer phone, left a generic VM to call office back.  If patient calls back please schedule an appointment with Dr. Andree Kayser or if form needs to be completed sooner (05/07/2024), Dr Andree Kayser stated that he can maybe go to Urgent Care to be completed unless he can get an extension.   Christ Courier, CMA

## 2024-05-05 NOTE — Telephone Encounter (Signed)
 Patient returns call to nurse line.   Patient advised he would need to be seen by PCP before any paperwork could be completed.   Patient scheduled for 6/18.  Informed patient to possible extend deadline to accommodate PCP date.

## 2024-06-03 ENCOUNTER — Encounter: Payer: Self-pay | Admitting: Family Medicine

## 2024-06-03 ENCOUNTER — Ambulatory Visit: Admitting: Family Medicine

## 2024-06-03 VITALS — BP 121/92 | HR 65 | Ht 69.5 in | Wt 170.2 lb

## 2024-06-03 DIAGNOSIS — Z0000 Encounter for general adult medical examination without abnormal findings: Secondary | ICD-10-CM

## 2024-06-03 MED ORDER — ALBUTEROL SULFATE HFA 108 (90 BASE) MCG/ACT IN AERS: 2.0000 | INHALATION_SPRAY | Freq: Four times a day (QID) | RESPIRATORY_TRACT | 2 refills | Status: AC | PRN

## 2024-06-03 MED ORDER — DIVALPROEX SODIUM 125 MG PO DR TAB: DELAYED_RELEASE_TABLET | ORAL | 1 refills | Status: AC

## 2024-06-03 NOTE — Patient Instructions (Signed)
 Go to Department  of Social Services and ask to speak with a case worker.  Ask them what you need to do to update your social security benefits.

## 2024-06-03 NOTE — Progress Notes (Signed)
    CHIEF COMPLAINT / HPI: Here to reestablish care.  He received some paperwork from social service department and they needed some forms filled out by his physician.  Since he not been to see a physician in 7 years he unfortunately missed the deadline.  He is here to reestablish.  No current issues although he still intermittently has problems with sleep at night.  He would like to restart one of the medicines he used before which he thought was helpful for that but maybe had a lower dose.  He said previously he took 2 of the 900 mg Depakote  tablets at night and that was way too much.  He also has some trouble with very large tablets. He is currently living by himself in a house.  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  There is no summer portion of that job so during the summer he has to find other employment and he is looking for that now.  His ultimate goal is to move to [place]  because he feels like that would be a new start for him.  He said he is never been to [place]  but he knows some people there.    PERTINENT  PMH / PSH: I have reviewed the patient's medications, allergies, past medical and surgical history, smoking status and updated in the EMR as appropriate.   OBJECTIVE:  BP (!) 121/92   Pulse 65   Ht 5' 9.5 (1.765 m)   Wt 170 lb 3.2 oz (77.2 kg)   SpO2 99%   BMI 24.77 kg/m  Vital signs reviewed. GENERAL: Well-developed, well-nourished, no acute distress. CARDIOVASCULAR: Regular rate and rhythm no murmur gallop or rub LUNGS: Clear to auscultation bilaterally, no rales or wheeze. ABDOMEN: Soft positive bowel sounds NEURO: No gross focal neurological deficits. MSK: Movement of extremity x 4. PSYCH: AxOx4. Good eye contact.. No psychomotor retardation or agitation. Appropriate speech fluency and content. Asks and answers questions appropriately. Mood is congruent.   ASSESSMENT / PLAN:  Well adult exam: We  talked about what he needs to do to get the paperwork from DSS so we can try to fill it out.  He will try to accomplish that in the next few days.  Will restart him on a lower dose of Depakote .  He asked about the pill size and I am not sure of that so if it is overwhelmingly large for him, we could consider the sprinkles.  Additionally I will refill his asthma inhaler although he does not have to use it very often at all.  Will get some blood work today and follow-up with him about that. No problem-specific Assessment & Plan notes found for this encounter.   Violetta Grice MD

## 2024-09-06 ENCOUNTER — Encounter (HOSPITAL_BASED_OUTPATIENT_CLINIC_OR_DEPARTMENT_OTHER): Payer: Self-pay | Admitting: Emergency Medicine

## 2024-09-06 ENCOUNTER — Emergency Department (HOSPITAL_BASED_OUTPATIENT_CLINIC_OR_DEPARTMENT_OTHER)

## 2024-09-06 ENCOUNTER — Emergency Department (HOSPITAL_BASED_OUTPATIENT_CLINIC_OR_DEPARTMENT_OTHER)
Admission: EM | Admit: 2024-09-06 | Discharge: 2024-09-06 | Disposition: A | Discharge status: Home / Self Care | Attending: Emergency Medicine | Admitting: Emergency Medicine

## 2024-09-06 DIAGNOSIS — Z7951 Long term (current) use of inhaled steroids: Secondary | ICD-10-CM | POA: Diagnosis not present

## 2024-09-06 DIAGNOSIS — R079 Chest pain, unspecified: Secondary | ICD-10-CM

## 2024-09-06 DIAGNOSIS — I10 Essential (primary) hypertension: Secondary | ICD-10-CM | POA: Insufficient documentation

## 2024-09-06 DIAGNOSIS — J45909 Unspecified asthma, uncomplicated: Secondary | ICD-10-CM | POA: Diagnosis not present

## 2024-09-06 DIAGNOSIS — R0789 Other chest pain: Secondary | ICD-10-CM | POA: Insufficient documentation

## 2024-09-06 LAB — COMPREHENSIVE METABOLIC PANEL WITH GFR
ALT: 6 U/L (ref 0–44)
AST: 25 U/L (ref 15–41)
Albumin: 4.2 g/dL (ref 3.5–5.0)
Alkaline Phosphatase: 73 U/L (ref 38–126)
Anion gap: 12 (ref 5–15)
BUN: 8 mg/dL (ref 6–20)
CO2: 24 mmol/L (ref 22–32)
Calcium: 9.8 mg/dL (ref 8.9–10.3)
Chloride: 104 mmol/L (ref 98–111)
Creatinine, Ser: 0.9 mg/dL (ref 0.61–1.24)
GFR, Estimated: >60 mL/min (ref >60)
Glucose, Bld: 86 mg/dL (ref 70–99)
Potassium: 3.9 mmol/L (ref 3.5–5.1)
Sodium: 139 mmol/L (ref 135–145)
Total Bilirubin: 0.9 mg/dL (ref 0.0–1.2)
Total Protein: 7.8 g/dL (ref 6.5–8.1)

## 2024-09-06 LAB — CBC
HCT: 41.2 % (ref 39.0–52.0)
Hemoglobin: 13.5 g/dL (ref 13.0–17.0)
MCH: 30.5 pg (ref 26.0–34.0)
MCHC: 32.8 g/dL (ref 30.0–36.0)
MCV: 93 fL (ref 80.0–100.0)
Platelets: 237 K/uL (ref 150–400)
RBC: 4.43 MIL/uL (ref 4.22–5.81)
RDW: 11.1 % — ABNORMAL LOW (ref 11.5–15.5)
WBC: 6.7 K/uL (ref 4.0–10.5)
nRBC: 0 % (ref 0.0–0.2)

## 2024-09-06 LAB — TROPONIN T, HIGH SENSITIVITY: Troponin T High Sensitivity: <15 ng/L (ref 0–19)

## 2024-09-06 NOTE — ED Triage Notes (Signed)
 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

## 2024-09-06 NOTE — Discharge Instructions (Signed)
 Make an appointment to follow-up within the next few days with your primary care doctor.  Return to the emergency room if you have any worsening symptoms.

## 2024-09-06 NOTE — ED Provider Notes (Signed)
 [facility] EMER[facility] AT [facility] Provider Note   CSN: [id number] Arrival date & time: 09/06/24  8084     Patient presents with: Chest Pain   Dylan Frank is a 38 y.o. male.   Patient is a 38 year old with a history of hypertension and asthma who presents with chest pain.  He states he has had 2 episodes of chest pain at work over the last month.  The first episode was a few weeks ago and then most recently he had an episode 5 days ago.  He describes a tightening up of his left chest.  It happened after he was lifting heavy stuff at work.  It seems to be more with the motion of lifting rather than exertion.  He did not have other symptoms in the weeks between the 1st and 2nd episodes when he was continuing to work.  No episodes at home.  No associated shortness of breath.  No nausea or vomiting.  No diaphoresis.  His last episode was 5 days ago.  No history of prior heart disease.  He denies any drug use.  No alcohol use.  No tobacco use.       Prior to Admission medications   Medication Sig Start Date End Date Taking? Authorizing Provider  albuterol  (VENTOLIN  HFA) 108 (90 Base) MCG/ACT inhaler Inhale 2 puffs into the lungs every 6 (six) hours as needed for wheezing or shortness of breath. 06/03/24   Rosalynn Camie CROME, MD  divalproex  (DEPAKOTE ) 125 MG DR tablet Take one by mouth at bedtime 06/03/24   Rosalynn Camie CROME, MD  divalproex  (DEPAKOTE ) 125 MG DR tablet Take 125 mg by mouth 3 (three) times daily.    [provider]  Pseudoeph-CPM-DM-APAP (TYLENOL  COLD & FLU DAY/NIGHT PO) Take 1 tablet by mouth 2 (two) times daily as needed (Cold sx).    [provider]  fluticasone  (FLONASE ) 50 MCG/ACT nasal spray Place 1 spray into both nostrils daily. 12/23/13 04/28/20  Rosalynn Camie CROME, MD    Allergies: Mushroom extract complex (obsolete)    Review of Systems  Constitutional:  Negative for chills, diaphoresis, fatigue and fever.  HENT:  Negative for congestion,  rhinorrhea and sneezing.   Eyes: Negative.   Respiratory:  Positive for chest tightness. Negative for cough and shortness of breath.   Cardiovascular:  Negative for chest pain and leg swelling.  Gastrointestinal:  Negative for abdominal pain, diarrhea, nausea and vomiting.  Genitourinary:  Negative for difficulty urinating, flank pain and frequency.  Musculoskeletal:  Negative for arthralgias and back pain.  Skin:  Negative for rash.  Neurological:  Negative for dizziness, speech difficulty, weakness, numbness and headaches.    Updated Vital Signs BP 111/77   Pulse 64   Temp 98.6 F (37 C) (Oral)   Resp 16   SpO2 97%   Physical Exam Constitutional:      Appearance: He is well-developed.  HENT:     Head: Normocephalic and atraumatic.  Eyes:     Pupils: Pupils are equal, round, and reactive to light.  Cardiovascular:     Rate and Rhythm: Normal rate and regular rhythm.     Heart sounds: Normal heart sounds.  Pulmonary:     Effort: Pulmonary effort is normal. No respiratory distress.     Breath sounds: Normal breath sounds. No wheezing or rales.  Chest:     Chest wall: Tenderness (Some reproducible tenderness on palpation of the left chest wall) present.  Abdominal:  General: Bowel sounds are normal.     Palpations: Abdomen is soft.     Tenderness: There is no abdominal tenderness. There is no guarding or rebound.  Musculoskeletal:        General: Normal range of motion.     Cervical back: Normal range of motion and neck supple.     Comments: No edema or calf tenderness  Lymphadenopathy:     Cervical: No cervical adenopathy.  Skin:    General: Skin is warm and dry.     Findings: No rash.  Neurological:     Mental Status: He is alert and oriented to person, place, and time.     (all labs ordered are listed, but only abnormal results are displayed) Labs Reviewed  CBC - Abnormal; Notable for the following components:      Result Value   RDW 11.1 (*)    All other  components within normal limits  COMPREHENSIVE METABOLIC PANEL WITH GFR  TROPONIN T, HIGH SENSITIVITY    EKG: EKG Interpretation Date/Time:  Sunday September 06 2024 19:23:01 EDT Ventricular Rate:  63 PR Interval:  139 QRS Duration:  101 QT Interval:  380 QTC Calculation: 389 R Axis:   -4  Text Interpretation: Sinus rhythm RSR' in V1 or V2, right VCD or RVH ST elev, probable normal early repol pattern since last tracing no significant change Confirmed by Lenor Hollering [phone]) on 09/06/2024 7:32:40 PM  Radiology: [facility] Chest Port 1 View Result Date: 09/06/2024 EXAM: 1 VIEW XRAY OF THE CHEST 09/06/2024 07:54:00 PM COMPARISON: 09/17/2023 CLINICAL HISTORY: cp. 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 No pulmonary edema. No pleural effusion. No pneumothorax. HEART AND MEDIASTINUM: No acute abnormality of the cardiac and mediastinal silhouettes. BONES AND SOFT TISSUES: No acute osseous abnormality. IMPRESSION: 1. No acute abnormalities. Electronically signed by: Pinkie Pebbles MD 09/06/2024 07:58 PM EDT RP Workstation: [id number]     Procedures   Medications Ordered in the ED - No data to display                                  Medical Decision Making  This patient presents to the ED for concern of chest pain, this involves an extensive number of treatment options, and is a complaint that carries with it a high risk of complications and morbidity.  I considered the following differential and admission for this acute, potentially life threatening condition.  The differential diagnosis includes ACS, costochondritis, pericarditis, pneumonia, aortic dissection, PE, pulmonary edema  MDM:    Patient is a 38 year old who presents with 2 episodes of pain in his left chest over the last month.  He has not had any pain  for the last 5 days.  He says when it happened he was doing some heavy lifting at work.  It seemed to be more related to the lifting rather than exertion.  He does not have any ongoing symptoms.  No associated symptoms such as shortness of breath.  He does not have other symptoms that would be more concerning for PE or aortic dissection.  No current pain.  EKG does not show any ischemic changes.  Labs are nonconcerning.  Troponin is negative.  Only 1 troponin was done given that his pain was not recent.  Chest x-ray does not show any evidence of pneumonia or pneumothorax.  No pulmonary edema.  He does have some reproducible tenderness on exam and this could represent costochondritis.  He was advised that he could use ibuprofen  and Tylenol  to see if that helps his symptoms.  He was encouraged to have close follow-up with his PCP.  Return precautions were given.  (Labs, imaging, consults)  Labs: I Ordered, and personally interpreted labs.  The pertinent results include: Normal troponin, otherwise nonconcerning  Imaging Studies ordered: I ordered imaging studies including chest x-ray I independently visualized and interpreted imaging. I agree with the radiologist interpretation  Additional history obtained from chart.  External records from outside source obtained and reviewed including prior notes  Cardiac Monitoring: The patient was maintained on a cardiac monitor.  If on the cardiac monitor, I personally viewed and interpreted the cardiac monitored which showed an underlying rhythm of: Sinus rhythm  Reevaluation: After the interventions noted above, I reevaluated the patient and found that they have :stayed the same  Social Determinants of Health:    Disposition: Discharged to home  Co morbidities that complicate the patient evaluation  Past Medical History:  Diagnosis Date   ADHD (attention deficit hyperactivity disorder)    Asthma    prn inhaler   Autism    Depression     Gynecomastia, male 05/2013   History of cardiac murmur    as a child - states no problems; no murmur, per PCP note 10/22/2012   Hypertension    under control with med., has been on med. x 3 mos.   Seizures (HCC)    Wears glasses      Medicines No orders of the defined types were placed in this encounter.   I have reviewed the patients home medicines and have made adjustments as needed  Problem List / ED Course: Problem List Items Addressed This Visit   None Visit Diagnoses       Chest pain, unspecified type    -  Primary                Final diagnoses:  Chest pain, unspecified type    ED Discharge Orders     None          Lenor Hollering, MD 09/06/24 2220

## 2024-12-23 ENCOUNTER — Ambulatory Visit (INDEPENDENT_AMBULATORY_CARE_PROVIDER_SITE_OTHER): Admitting: Family Medicine

## 2024-12-23 ENCOUNTER — Encounter: Payer: Self-pay | Admitting: Family Medicine

## 2024-12-23 VITALS — BP 121/73 | HR 82 | Ht 70.0 in | Wt 178.0 lb

## 2024-12-23 DIAGNOSIS — F909 Attention-deficit hyperactivity disorder, unspecified type: Secondary | ICD-10-CM | POA: Diagnosis not present

## 2024-12-23 DIAGNOSIS — F840 Autistic disorder: Secondary | ICD-10-CM

## 2024-12-23 NOTE — Patient Instructions (Signed)
 Great to see you! Let me see you in June and we can do your next check up then. Please call for an appointment if you have something come up before then.

## 2024-12-24 ENCOUNTER — Encounter: Payer: Self-pay | Admitting: Family Medicine

## 2024-12-24 DIAGNOSIS — F840 Autistic disorder: Secondary | ICD-10-CM | POA: Insufficient documentation

## 2024-12-24 NOTE — Progress Notes (Signed)
" ° °  Discussed the use of AI scribe software for clinical note transcription with the patient, who gave verbal consent to proceed.  History of Present Illness   Dylan Frank is a 39 year old male who presents with need for paperwork to be filled out  Working 2 part time jobs but has been on winter break for one of them so not having that extra income has made finances a little tight Pays his own bills. Did have to pay his rent a little late in December due to interruption in job at [employer]. Now back. Other job is at [employer]. Says both jobs OK. Feels relatively safe in his apartment. Feels lie most of the time he has adequate food Transportation usually by bus but also sometimes walks        PERTINENT  PMH / PSH: I have reviewed the patients medications, allergies, past medical and surgical history, smoking status.    Physical Exam     Vital signs reviewed. GENERAL: Well-developed, well-nourished, no acute distress. CARDIOVASCULAR: Regular rate and rhythm no murmur gallop or rub LUNGS: Clear to auscultation bilaterally, no rales or wheeze. ABDOMEN: Soft positive bowel sounds NEURO: No gross focal neurological deficits. MSK: Movement of extremity x 4. PSYCH: AxOx4. Good eye contact.. No psychomotor retardation or agitation.  speech fluency and content in normal range Asks and answers questions appropriately. Mood is congruent.     Assessment and Plan Form he needs filled out  is for him to manage his own finances which he seems to have been doing realtively successfully. He is employed (2 part time jobs) that both seem longstanding. Form completed and I will fax Gave him a copy Copy to chart          "

## 2025-01-04 ENCOUNTER — Encounter (HOSPITAL_COMMUNITY): Payer: Self-pay

## 2025-01-04 ENCOUNTER — Emergency Department (HOSPITAL_COMMUNITY)

## 2025-01-04 ENCOUNTER — Other Ambulatory Visit: Payer: Self-pay

## 2025-01-04 ENCOUNTER — Emergency Department (HOSPITAL_COMMUNITY)
Admission: EM | Admit: 2025-01-04 | Discharge: 2025-01-04 | Disposition: A | Discharge status: Home / Self Care | Attending: Emergency Medicine | Admitting: Emergency Medicine

## 2025-01-04 DIAGNOSIS — R000 Tachycardia, unspecified: Secondary | ICD-10-CM | POA: Insufficient documentation

## 2025-01-04 DIAGNOSIS — N451 Epididymitis: Secondary | ICD-10-CM | POA: Insufficient documentation

## 2025-01-04 LAB — BASIC METABOLIC PANEL WITH GFR
Anion gap: 10 (ref 5–15)
BUN: 7 mg/dL (ref 6–20)
CO2: 24 mmol/L (ref 22–32)
Calcium: 9.5 mg/dL (ref 8.9–10.3)
Chloride: 104 mmol/L (ref 98–111)
Creatinine, Ser: 0.85 mg/dL (ref 0.61–1.24)
GFR, Estimated: >60 mL/min
Glucose, Bld: 80 mg/dL (ref 70–99)
Potassium: 3.8 mmol/L (ref 3.5–5.1)
Sodium: 138 mmol/L (ref 135–145)

## 2025-01-04 LAB — CBC
HCT: 44.2 % (ref 39.0–52.0)
Hemoglobin: 13.9 g/dL (ref 13.0–17.0)
MCH: 30.8 pg (ref 26.0–34.0)
MCHC: 31.4 g/dL (ref 30.0–36.0)
MCV: 98 fL (ref 80.0–100.0)
Platelets: 209 K/uL (ref 150–400)
RBC: 4.51 MIL/uL (ref 4.22–5.81)
RDW: 11.3 % — ABNORMAL LOW (ref 11.5–15.5)
WBC: 9.2 K/uL (ref 4.0–10.5)
nRBC: 0 % (ref 0.0–0.2)

## 2025-01-04 LAB — SYPHILIS: RPR W/REFLEX TO RPR TITER AND TREPONEMAL ANTIBODIES, TRADITIONAL SCREENING AND DIAGNOSIS ALGORITHM: RPR Ser Ql: NONREACTIVE

## 2025-01-04 LAB — HIV ANTIBODY (ROUTINE TESTING W REFLEX): HIV Screen 4th Generation wRfx: REACTIVE — AB

## 2025-01-04 MED ORDER — DOXYCYCLINE HYCLATE 100 MG PO TABS
100.0000 mg | ORAL_TABLET | Freq: Once | ORAL | Status: AC
  Administered 2025-01-04: 100 mg via ORAL
  Filled 2025-01-04: qty 1

## 2025-01-04 MED ORDER — DOXYCYCLINE HYCLATE 100 MG PO CAPS: 100.0000 mg | ORAL_CAPSULE | Freq: Two times a day (BID) | ORAL | 0 refills | Status: AC

## 2025-01-04 MED ORDER — OXYCODONE-ACETAMINOPHEN 5-325 MG PO TABS
1.0000 | ORAL_TABLET | Freq: Once | ORAL | Status: AC
  Administered 2025-01-04: 1 via ORAL
  Filled 2025-01-04: qty 1

## 2025-01-04 MED ORDER — SODIUM CHLORIDE 0.9 % IV SOLN
1.0000 g | Freq: Once | INTRAVENOUS | Status: AC
  Administered 2025-01-04: 1 g via INTRAVENOUS
  Filled 2025-01-04: qty 10

## 2025-01-04 MED ORDER — MORPHINE SULFATE (PF) 2 MG/ML IV SOLN
2.0000 mg | Freq: Once | INTRAVENOUS | Status: AC
  Administered 2025-01-04: 2 mg via INTRAVENOUS
  Filled 2025-01-04: qty 1

## 2025-01-04 NOTE — ED Triage Notes (Signed)
 Pt came I via POV d/t Rt groin pain that started hurting 2 days ago. A/Ox4, rates his pain 9/10 during triage, explains it as a burning sensation. Unaware what caused it, denies any recent falls/injuries.

## 2025-01-04 NOTE — ED Notes (Signed)
 Patient given urinal at bedside and asked to provide a sample whenever able to.

## 2025-01-04 NOTE — ED Notes (Signed)
 Patient transported to Ultrasound

## 2025-01-04 NOTE — ED Notes (Signed)
 Pt provided with discharge and follow up instructions, medications discussed, pt verbalized understanding. LDA removed, VSS, pt ambulatory out of ED w/ all paperwork and belongings in NAD.

## 2025-01-04 NOTE — Discharge Instructions (Addendum)
 Take the prescribed antibiotic doxycycline  twice a day for the next 7 days. Follow up with your PCP and urology.   You can take tylenol  and Ibuprofen  for pain.

## 2025-01-04 NOTE — ED Provider Notes (Signed)
 " [place] EMERGENCY DEPARTMENT AT [facility] Provider Note   CSN: [id number] Arrival date & time: 01/04/25  0747     Patient presents with: Groin Pain   Dylan Frank is a 39 y.o. male.   Dylan Frank is a 39 y.o. male presenting with right groin pain that started 2 days ago. Pain described as burning sensation, rated 9/10 and severe enough to disrupt sleep. He has new right testicular swelling. He denies penile discharge, dysuria, fever, or bowel changes. He noticed hematospermia last night. He has a history of syphilis and gonorrhea but has not been recently tested.   The history is provided by the patient.  Groin Pain This is a new problem. The current episode started 2 days ago. The problem occurs constantly. The problem has not changed since onset.      Prior to Admission medications  Medication Sig Start Date End Date Taking? Authorizing Provider  doxycycline  (VIBRAMYCIN ) 100 MG capsule Take 1 capsule (100 mg total) by mouth 2 (two) times daily for 7 days. 01/04/25 01/11/25 Yes Cleotilde Perkins, DO  albuterol  (VENTOLIN  HFA) 108 (90 Base) MCG/ACT inhaler Inhale 2 puffs into the lungs every 6 (six) hours as needed for wheezing or shortness of breath. 06/03/24   Rosalynn Camie CROME, MD  divalproex  (DEPAKOTE ) 125 MG DR tablet Take one by mouth at bedtime 06/03/24   Rosalynn Camie CROME, MD  divalproex  (DEPAKOTE ) 125 MG DR tablet Take 125 mg by mouth 3 (three) times daily.    [provider]  Pseudoeph-CPM-DM-APAP (TYLENOL  COLD & FLU DAY/NIGHT PO) Take 1 tablet by mouth 2 (two) times daily as needed (Cold sx).    [provider]  fluticasone  (FLONASE ) 50 MCG/ACT nasal spray Place 1 spray into both nostrils daily. 12/23/13 04/28/20  Rosalynn Camie CROME, MD    Allergies: Mushroom extract complex (obsolete)    Review of Systems  Constitutional:  Negative for activity change, appetite change, chills and fever.  Genitourinary:  Positive for penile pain and testicular pain. Negative for  dysuria, frequency and genital sores.    Updated Vital Signs BP 117/63 (BP Location: Right Arm)   Pulse (!) 104   Temp 98.9 F (37.2 C) (Oral)   Resp 16   Ht 5' 9.5 (1.765 m)   Wt 79.4 kg   SpO2 99%   BMI 25.47 kg/m   Physical Exam Exam conducted with a chaperone present.  Constitutional:      Appearance: He is normal weight. He is ill-appearing.  Cardiovascular:     Rate and Rhythm: Regular rhythm. Tachycardia present.     Pulses: Normal pulses.     Heart sounds: Normal heart sounds. No murmur heard. Pulmonary:     Effort: Pulmonary effort is normal. No respiratory distress.     Breath sounds: Normal breath sounds.  Abdominal:     Hernia: There is no hernia in the left inguinal area or right inguinal area.  Genitourinary:    Pubic Area: No rash.      Penis: Tenderness present. No discharge or lesions.      Testes:        Right: Tenderness and swelling present. Cremasteric reflex is absent.         Left: Tenderness or swelling not present. Cremasteric reflex is present.   Lymphadenopathy:     Lower Body: No right inguinal adenopathy. No left inguinal adenopathy.  Neurological:     Mental Status: He is alert.     (all  labs ordered are listed, but only abnormal results are displayed) Labs Reviewed  CBC - Abnormal; Notable for the following components:      Result Value   RDW 11.3 (*)    All other components within normal limits  BASIC METABOLIC PANEL WITH GFR  SYPHILIS: RPR W/REFLEX TO RPR TITER AND TREPONEMAL ANTIBODIES, TRADITIONAL SCREENING AND DIAGNOSIS ALGORITHM  URINALYSIS, ROUTINE W REFLEX MICROSCOPIC  HIV ANTIBODY (ROUTINE TESTING W REFLEX)  GC/CHLAMYDIA PROBE AMP ([place]) NOT AT [facility]    EKG: None  Radiology: US  SCROTUM W/DOPPLER Result Date: 01/04/2025 CLINICAL DATA:  Right testicular pain. EXAM: SCROTAL ULTRASOUND DOPPLER ULTRASOUND OF THE TESTICLES TECHNIQUE: Complete ultrasound examination of the testicles, epididymis, and other scrotal  structures was performed. Color and spectral Doppler ultrasound were also utilized to evaluate blood flow to the testicles. COMPARISON:  None Available. FINDINGS: Right testicle Measurements: 3.3 x 1.6 x 2.5 cm. No mass or microlithiasis visualized. The right testicle is slightly heterogeneous and hyperemic. Doppler: There is normal vascularity on color doppler examination. Spectral doppler arterial and venous waveforms are normal. Left testicle Measurements:  3.6 x 1.7 x 2.3 cm. No mass or microlithiasis visualized. Doppler: There is normal vascularity on color doppler examination. Spectral doppler arterial and venous waveforms are normal. Right epididymis: The right epididymis is enlarged heterogeneous and hyperemic. There is a 4 mm echogenic focus in the in the right epididymis which may represent a focus of calcification. Left epididymis:  Normal in size and appearance. Hydrocele:  There is a small right hydrocele. Varicocele:  None visualized. IMPRESSION: Findings most consistent with right-sided epididymo-orchitis. Clinical correlation is recommended. Electronically Signed   By: Vanetta Chou M.D.   On: 01/04/2025 10:27     Procedures   Medications Ordered in the ED  oxyCODONE -acetaminophen  (PERCOCET/ROXICET) 5-325 MG per tablet 1 tablet (has no administration in time range)  morphine  (PF) 2 MG/ML injection 2 mg (2 mg Intravenous Given 01/04/25 0902)  cefTRIAXone  (ROCEPHIN ) 1 g in sodium chloride  0.9 % 100 mL IVPB (1 g Intravenous New Bag/Given 01/04/25 1051)  doxycycline  (VIBRA -TABS) tablet 100 mg (100 mg Oral Given 01/04/25 1051)                                    Medical Decision Making Dylan Frank is a 39 y.o. male presenting with severe right testicular pain. On exam he is uncomfortable appearing, with notably swollen right testicle with absent cremasteric reflex. Severely tender to exam. Will order torsion US , STI testing and UA. Differential includes torsion, epididymidis, inguinal  hernia. Do not suspect necrotizing fascitis due to normal appearing skin.   US  testicle showed epididymitis/orchitis. Treated with rocephin  1 g and doxycycline . Patient stable for discharge outpatient with 7 day course of doxycyline. STI testing pending at time of discharge. Follow up with outpatient urology recommended.   Amount and/or Complexity of Data Reviewed Labs: ordered. Radiology: ordered.  Risk Prescription drug management.       Final diagnoses:  Epididymitis    ED Discharge Orders          Ordered    doxycycline  (VIBRAMYCIN ) 100 MG capsule  2 times daily        01/04/25 1115               Cleotilde Perkins, DO 01/04/25 1157  "

## 2025-01-05 LAB — HIV-1/2 AB - DIFFERENTIATION
HIV 1 Ab: REACTIVE — AB
HIV 2 Ab: UNDETERMINED

## 2025-01-06 ENCOUNTER — Telehealth: Payer: Self-pay

## 2025-01-06 ENCOUNTER — Other Ambulatory Visit (HOSPITAL_COMMUNITY): Payer: Self-pay

## 2025-01-06 DIAGNOSIS — B20 Human immunodeficiency virus [HIV] disease: Secondary | ICD-10-CM

## 2025-01-06 NOTE — Telephone Encounter (Signed)
 Patient newly diagnosed with HIV 01/04/25 after presenting to [facility] for epididymitis.   Patient has history of gonorrhea, chlamydia, syphilis.   Called patient to relay (+) HIV-1 results. Briefly reviewed HIV treatment and prognosis with medication. Scheduled for appointment next week 1/27.  Last documented (-) HIV test was 09/2023, Ladell does not think he has had another test since then. He has taken oral PrEP before, but cannot remember exactly when this was. He was last sexually active on 1/16 and has male partners exclusively. No history of IVDU.  Nur has transportation barriers. Spent approximately 60 minutes trying to contact Medicaid to set up transportation for patient, unable to get connected with anyone that could help. Financial counselor will call patient to complete RW application over the phone and front desk will assist with coordinating transportation via rideshare.   He is facing eviction if he cannot pay rent by 1/25. Spoke with Catrice, she will check to see if they have emergency funds to cover the $971 he owes. Asked Areli to locate a copy of his lease and a letter stating the amount he owes. Faxed patient demographics to Catrice with [facility] and referral placed for housing.   If [facility] does not have sufficient funds, [facility] should be able to assist.  Kymani Shimabukuro, BSN, RN

## 2025-01-06 NOTE — Telephone Encounter (Signed)
 Received call from [facility], [facility] will be able to assist with emergency funds of $971. Catrice will reach out to the patient directly.  Sylvia Kondracki, BSN, RN

## 2025-01-07 ENCOUNTER — Ambulatory Visit

## 2025-01-07 ENCOUNTER — Telehealth: Payer: Self-pay

## 2025-01-07 ENCOUNTER — Other Ambulatory Visit (HOSPITAL_COMMUNITY): Payer: Self-pay

## 2025-01-07 VITALS — Ht 70.0 in | Wt 178.0 lb

## 2025-01-07 DIAGNOSIS — Z0000 Encounter for general adult medical examination without abnormal findings: Secondary | ICD-10-CM

## 2025-01-07 NOTE — Patient Instructions (Signed)
 Dylan Frank,  Thank you for taking the time for your Medicare Wellness Visit. I appreciate your continued commitment to your health goals. Please review the care plan we discussed, and feel free to reach out if I can assist you further.  Please note that Annual Wellness Visits do not include a physical exam. Some assessments may be limited, especially if the visit was conducted virtually. If needed, we may recommend an in-person follow-up with your provider.  Ongoing Care Seeing your primary care provider every 3 to 6 months helps us  monitor your health and provide consistent, personalized care.   Referrals If a referral was made during today's visit and you haven't received any updates within two weeks, please contact the referred provider directly to check on the status.  There are several Eye Doctors in your area. Here are a few that usually accept all insurance types:  [facility] [address] [place], [place] [address] Phone: [phone]  [facility] [address] [place], [place] [address] Phone: [phone]  [facility] [address] [place], [place] [address] Phone: [phone]  [facility] [address] [place], [place] [address] Phone: [phone]  [facility] [address] [place], [place] [address] Phone: [phone]  Please let us  know if you require a referral for an eye exam appointment. Thank you!   Recommended Screenings:  Health Maintenance  Topic Date Due   Medicare Annual Wellness Visit  Never done   DTaP/Tdap/Td vaccine (1 - Tdap) Never done   Pneumococcal Vaccine (1 of 2 - PCV) Never done   Hepatitis B Vaccine (1 of 3 - 19+ 3-dose series) Never done   COVID-19 Vaccine (3 - Moderna risk series) 05/31/2020   Flu Shot  07/17/2024   HPV Vaccine (No Doses Required) Completed   Hepatitis C Screening  Completed   HIV Screening  Completed   Meningitis B Vaccine  Aged Out       01/07/2025     3:36 PM  Advanced Directives  Does Patient Have a Medical Advance Directive? No  Would patient like information on creating a medical advance directive? No - Patient declined    Vision: Annual vision screenings are recommended for early detection of glaucoma, cataracts, and diabetic retinopathy. These exams can also reveal signs of chronic conditions such as diabetes and high blood pressure.  Dental: Annual dental screenings help detect early signs of oral cancer, gum disease, and other conditions linked to overall health, including heart disease and diabetes.  Please see the attached documents for additional preventive care recommendations.

## 2025-01-07 NOTE — Progress Notes (Addendum)
 "  Chief Complaint  Patient presents with   Medicare Wellness    INITIAL     Subjective:   Dylan Frank is a 39 y.o. male who presents for a Medicare Annual Wellness Visit.  Visit info / Clinical Intake: Medicare Wellness Visit Type:: Initial Annual Wellness Visit Persons participating in visit and providing information:: patient Medicare Wellness Visit Mode:: Telephone If telephone:: video declined Since this visit was completed virtually, some vitals may be partially provided or unavailable. Missing vitals are due to the limitations of the virtual format.: Documented vitals are patient reported If Telephone or Video please confirm:: I connected with patient using audio/video enable telemedicine. I verified patient identity with two identifiers, discussed telehealth limitations, and patient agreed to proceed. Patient Location:: HOME Provider Location:: OFFICE Interpreter Needed?: No Pre-visit prep was completed: yes AWV questionnaire completed by patient prior to visit?: no Living arrangements:: (!) lives alone Patient's Overall Health Status Rating: good Typical amount of pain: none Does pain affect daily life?: no Are you currently prescribed opioids?: no  Dietary Habits and Nutritional Risks How many meals a day?: 2 Eats fruit and vegetables daily?: yes Most meals are obtained by: eating out In the last 2 weeks, have you had any of the following?: none Diabetic:: no  Functional Status Activities of Daily Living (to include ambulation/medication): Independent Ambulation: Independent with device- listed below Home Assistive Devices/Equipment: Eyeglasses Medication Administration: Independent Home Management (perform basic housework or laundry): Independent Manage your own finances?: yes Primary transportation is: family / friends Concerns about vision?: no *vision screening is required for WTM* Concerns about hearing?: no  Fall Screening Falls in the past year?:  0 Number of falls in past year: 0 Was there an injury with Fall?: 0 Fall Risk Category Calculator: 0 Patient Fall Risk Level: Low Fall Risk  Fall Risk Patient at Risk for Falls Due to: No Fall Risks Fall risk Follow up: Falls evaluation completed; Education provided  Home and Transportation Safety: All rugs have non-skid backing?: N/A, no rugs All stairs or steps have railings?: N/A, no stairs ([place]) Grab bars in the bathtub or shower?: yes Have non-skid surface in bathtub or shower?: yes Good home lighting?: yes Regular seat belt use?: yes Hospital stays in the last year:: no  Cognitive Assessment Difficulty concentrating, remembering, or making decisions? : no Will 6CIT or Mini Cog be Completed: yes What year is it?: 0 points What month is it?: 0 points Give patient an address phrase to remember (5 components): [address] [place] About what time is it?: 0 points Count backwards from 20 to 1: 0 points Say the months of the year in reverse: 0 points Repeat the address phrase from earlier: 0 points 6 CIT Score: 0 points  Advance Directives (For Healthcare) Does Patient Have a Medical Advance Directive?: No Would patient like information on creating a medical advance directive?: No - Patient declined  Reviewed/Updated  Reviewed/Updated: Reviewed All (Medical, Surgical, Family, Medications, Allergies, Care Teams, Patient Goals)    Allergies (verified) Mushroom extract complex (obsolete)   Current Medications (verified) Outpatient Encounter Medications as of 01/07/2025  Medication Sig   albuterol  (VENTOLIN  HFA) 108 (90 Base) MCG/ACT inhaler Inhale 2 puffs into the lungs every 6 (six) hours as needed for wheezing or shortness of breath. (Patient not taking: Reported on 01/07/2025)   divalproex  (DEPAKOTE ) 125 MG DR tablet Take one by mouth at bedtime (Patient not taking: Reported on 01/07/2025)   divalproex  (DEPAKOTE ) 125 MG DR  tablet Take 125 mg by mouth 3  (three) times daily. (Patient not taking: Reported on 01/07/2025)   doxycycline  (VIBRAMYCIN ) 100 MG capsule Take 1 capsule (100 mg total) by mouth 2 (two) times daily for 7 days. (Patient not taking: Reported on 01/07/2025)   Pseudoeph-CPM-DM-APAP (TYLENOL  COLD & FLU DAY/NIGHT PO) Take 1 tablet by mouth 2 (two) times daily as needed (Cold sx). (Patient not taking: Reported on 01/07/2025)   [DISCONTINUED] fluticasone  (FLONASE ) 50 MCG/ACT nasal spray Place 1 spray into both nostrils daily.   No facility-administered encounter medications on file as of 01/07/2025.    History: Past Medical History:  Diagnosis Date   ADHD (attention deficit hyperactivity disorder)    Asthma    prn inhaler   Autism    Depression    Gynecomastia, male 05/2013   History of cardiac murmur    as a child - states no problems; no murmur, per PCP note 10/22/2012   Hypertension    under control with med., has been on med. x 3 mos.   Seizures (HCC)    Wears glasses    Past Surgical History:  Procedure Laterality Date   BREAST REDUCTION SURGERY Bilateral 05/25/2013   Procedure: BILATERAL BREAST REDUCTION WITH LIPO ASSISTANCE left breast;  Surgeon: Elna Pick, MD;  Location: [facility];  Service: Plastics;  Laterality: Bilateral;   CIRCUMCISION     age 48   MEATOTOMY     age 48   Family History  Problem Relation Age of Onset   Diabetes Mother    Other Mother        visually impaired   Social History   Occupational History   Not on file  Tobacco Use   Smoking status: Never   Smokeless tobacco: Never  Substance and Sexual Activity   Alcohol use: Yes    Comment: occ   Drug use: Yes    Types: Marijuana   Sexual activity: Never   Tobacco Counseling Counseling given: Not Answered  SDOH Screenings   Food Insecurity: Food Insecurity Present (01/07/2025)  Housing: Low Risk (01/07/2025)  Transportation Needs: No Transportation Needs (01/07/2025)  Utilities: Not At Risk (01/07/2025)  Alcohol  Screen: Low Risk (01/07/2025)  Depression (PHQ2-9): Medium Risk (01/07/2025)  Financial Resource Strain: Medium Risk (01/07/2025)  Physical Activity: Sufficiently Active (01/07/2025)  Social Connections: Socially Isolated (01/07/2025)  Stress: No Stress Concern Present (01/07/2025)  Tobacco Use: Low Risk (01/07/2025)  Health Literacy: Adequate Health Literacy (01/07/2025)   See flowsheets for full screening details  Depression Screen PHQ 2 & 9 Depression Scale- Over the past 2 weeks, how often have you been bothered by any of the following problems? Little interest or pleasure in doing things: 0 Feeling down, depressed, or hopeless (PHQ Adolescent also includes...irritable): 0 PHQ-2 Total Score: 0 Trouble falling or staying asleep, or sleeping too much: 3 Feeling tired or having little energy: 2 Poor appetite or overeating (PHQ Adolescent also includes...weight loss): 2 Feeling bad about yourself - or that you are a failure or have let yourself or your family down: 0 Trouble concentrating on things, such as reading the newspaper or watching television (PHQ Adolescent also includes...like school work): 0 Moving or speaking so slowly that other people could have noticed. Or the opposite - being so fidgety or restless that you have been moving around a lot more than usual: 0 Thoughts that you would be better off dead, or of hurting yourself in some way: 0 PHQ-9 Total Score: 7 If you checked  off any problems, how difficult have these problems made it for you to do your work, take care of things at home, or get along with other people?: Not difficult at all     Goals Addressed             This Visit's Progress    01/07/2025: To stay healthy and to maintain.               Objective:    Today's Vitals   01/07/25 1534  Weight: 178 lb (80.7 kg)  Height: 5' 10 (1.778 m)  PainSc: 0-No pain   Body mass index is 25.54 kg/m.  Hearing/Vision screen No results found. Immunizations and  Health Maintenance Health Maintenance  Topic Date Due   DTaP/Tdap/Td (1 - Tdap) Never done   Pneumococcal Vaccine (1 of 2 - PCV) Never done   Hepatitis B Vaccines 19-59 Average Risk (1 of 3 - 19+ 3-dose series) Never done   COVID-19 Vaccine (3 - Moderna risk series) 05/31/2020   Influenza Vaccine  07/17/2024   Medicare Annual Wellness (AWV)  01/07/2026   HPV VACCINES (No Doses Required) Completed   Hepatitis C Screening  Completed   HIV Screening  Completed   Meningococcal B Vaccine  Aged Out        Assessment/Plan:  This is a routine wellness examination for Dylan Frank.  Patient Care Team: Rosalynn Camie CROME, MD as PCP - General (Family Medicine)  I have personally reviewed and noted the following in the patients chart:   Medical and social history Use of alcohol, tobacco or illicit drugs  Current medications and supplements including opioid prescriptions. Functional ability and status Nutritional status Physical activity Advanced directives List of other physicians Hospitalizations, surgeries, and ER visits in previous 12 months Vitals Screenings to include cognitive, depression, and falls Referrals and appointments  No orders of the defined types were placed in this encounter.  In addition, I have reviewed and discussed with patient certain preventive protocols, quality metrics, and best practice recommendations. A written personalized care plan for preventive services as well as general preventive health recommendations were provided to patient.   Roz LOISE Fuller, LPN   8/77/7973   Return in about 1 year (around 01/07/2026) for Medicare wellness.  After Visit Summary: (MyChart) Due to this being a telephonic visit, the after visit summary with patients personalized plan was offered to patient via MyChart   Nurse Notes: Patient aware of current care gaps. Patient is due for Covid, Flu, Dtap, Pneumococcal, Hepatitis B Vaccines. "

## 2025-01-07 NOTE — Telephone Encounter (Signed)
 Pharmacy Patient Advocate Encounter  Insurance verification completed.   The patient is insured through [employer] claim for [employer]. Currently a quantity of 30 is a 30 day supply and the co-pay is 12.65 . [place] $12.65  This test claim was processed through [facility]- copay amounts may vary at other pharmacies due to pharmacy/plan contracts, or as the patient moves through the different stages of their insurance plan.

## 2025-01-07 NOTE — Telephone Encounter (Signed)
 Patient called office to ask if there was any assistance to help pay utilities.  Will try to connect pt to Deanna for assistance or THP. Lorenda CHRISTELLA Code, RMA

## 2025-01-11 ENCOUNTER — Telehealth: Payer: Self-pay | Admitting: *Deleted

## 2025-01-11 NOTE — Telephone Encounter (Signed)
 Meg, [place] DSS called regarding pt recent labs.   Taheera Thomann J. Debarah, BSN, RN, [facility]  Inpatient Care Management  Nurse Case Manager  [facility] Emergency Departments  Operative Services  [phone]

## 2025-01-12 ENCOUNTER — Other Ambulatory Visit (HOSPITAL_COMMUNITY)
Admission: RE | Admit: 2025-01-12 | Discharge: 2025-01-12 | Disposition: A | Discharge status: Home / Self Care | Source: Ambulatory Visit | Attending: Family | Admitting: Family

## 2025-01-12 ENCOUNTER — Ambulatory Visit (INDEPENDENT_AMBULATORY_CARE_PROVIDER_SITE_OTHER): Payer: Self-pay | Admitting: Family

## 2025-01-12 ENCOUNTER — Other Ambulatory Visit: Payer: Self-pay

## 2025-01-12 ENCOUNTER — Ambulatory Visit (INDEPENDENT_AMBULATORY_CARE_PROVIDER_SITE_OTHER): Payer: Self-pay | Admitting: Pharmacist

## 2025-01-12 ENCOUNTER — Encounter: Payer: Self-pay | Admitting: Family

## 2025-01-12 VITALS — BP 122/78 | HR 74 | Temp 98.2°F | Ht 69.0 in | Wt 174.0 lb

## 2025-01-12 DIAGNOSIS — Z113 Encounter for screening for infections with a predominantly sexual mode of transmission: Secondary | ICD-10-CM | POA: Diagnosis present

## 2025-01-12 DIAGNOSIS — N453 Epididymo-orchitis: Secondary | ICD-10-CM | POA: Diagnosis not present

## 2025-01-12 DIAGNOSIS — Z79899 Other long term (current) drug therapy: Secondary | ICD-10-CM

## 2025-01-12 DIAGNOSIS — Z609 Problem related to social environment, unspecified: Secondary | ICD-10-CM | POA: Diagnosis not present

## 2025-01-12 DIAGNOSIS — B20 Human immunodeficiency virus [HIV] disease: Secondary | ICD-10-CM | POA: Insufficient documentation

## 2025-01-12 MED ORDER — LEVOFLOXACIN 500 MG PO TABS: 500.0000 mg | ORAL_TABLET | Freq: Every day | ORAL | 0 refills | Status: AC

## 2025-01-12 MED ORDER — BIKTARVY 50-200-25 MG PO TABS: 1.0000 | ORAL_TABLET | Freq: Every day | ORAL | 3 refills | Status: AC

## 2025-01-12 NOTE — Progress Notes (Signed)
 NEW REFERRAL TO CPP CLINIC

## 2025-01-12 NOTE — Progress Notes (Signed)
 "   Brief Narrative   Patient ID: Dylan Frank, male    DOB: 05/16/1986, 39 y.o.   MRN: [id number]  Dylan Frank is a 39 y/o AA male diagnosed with HIV disease on 01/04/25 with risk factor of MSM. Initial lab work pending. No history of opportunistic infection. Started on Biktarvy .   Subjective:   Chief Complaint  Patient presents with   New Patient (Initial Visit)    B20    HPI:  Dylan Frank is a 39 y.o. male with previous medical history of autism,  gonorrhea and syphilis presenting today for initial office visit for newly diagnosed HIV disease.   Dylan Frank was recently seen in the [facility] ED with the acute onset groin pain and right testicular swelling with hematospermia in the setting of previous gonorrhea and syphilis history. US  scrotum with findings concerning for right sided epididymo-orchitis. This was treated with ceftriaxone  and was prescribed doxycycline  for 7 days. RPR reviewed and negative for syphilis and HIV testing returned with a positive 4th generation and HIV-1 antibody and indeterminate HIV-2 antibody. Referred to Infectious Disease with newly diagnosed HIV disease.  Dylan Frank last tested negative for HIV disease in October 2024 and was previously on PrEP through the end of the year.  Risk factor for acquiring HIV includes MSM where he is the top partner. Currently covered by [facility] and has been enrolled in SPAP. Previously diagnosed with Autism and on Depakote  for mood stabilization when he has not been taking. Working two jobs currently and having difficulty paying the bills and is behind on his rent payments with the risk of possible eviction at the end of February. Primary mode of transportation is walking and used Medicare transport to attend today's appointment. Drinks alcohol on occasion and smokes tobacco and marijuana on occasion.   In regards to his previous epididymo-orchitis he was unable to pick up the doxycycline  as prescribed and  continues to have some remaining pain that is improved with time.  Denies fevers, chills, night sweats, headaches, changes in vision, neck pain/stiffness, nausea, diarrhea, vomiting, lesions or rashes.   Allergies[1]    Outpatient Medications Prior to Visit  Medication Sig Dispense Refill   albuterol  (VENTOLIN  HFA) 108 (90 Base) MCG/ACT inhaler Inhale 2 puffs into the lungs every 6 (six) hours as needed for wheezing or shortness of breath. (Patient not taking: Reported on 01/12/2025) 8 g 2   divalproex  (DEPAKOTE ) 125 MG DR tablet Take one by mouth at bedtime (Patient not taking: Reported on 01/12/2025) 30 tablet 1   divalproex  (DEPAKOTE ) 125 MG DR tablet Take 125 mg by mouth 3 (three) times daily. (Patient not taking: Reported on 01/12/2025)     Pseudoeph-CPM-DM-APAP (TYLENOL  COLD & FLU DAY/NIGHT PO) Take 1 tablet by mouth 2 (two) times daily as needed (Cold sx). (Patient not taking: Reported on 01/12/2025)     No facility-administered medications prior to visit.     Past Medical History:  Diagnosis Date   ADHD (attention deficit hyperactivity disorder)    Asthma    prn inhaler   Autism    Depression    Gynecomastia, male 05/2013   History of cardiac murmur    as a child - states no problems; no murmur, per PCP note 10/22/2012   Hypertension    under control with med., has been on med. x 3 mos.   Seizures (HCC)    Wears glasses      Past Surgical History:  Procedure  Laterality Date   BREAST REDUCTION SURGERY Bilateral 05/25/2013   Procedure: BILATERAL BREAST REDUCTION WITH LIPO ASSISTANCE left breast;  Surgeon: Elna Pick, MD;  Location: [facility];  Service: Plastics;  Laterality: Bilateral;   CIRCUMCISION     age 48   MEATOTOMY     age 48        Review of Systems  Constitutional:  Negative for appetite change, chills, fatigue, fever and unexpected weight change.  Eyes:  Negative for visual disturbance.  Respiratory:  Negative for cough, chest  tightness, shortness of breath and wheezing.   Cardiovascular:  Negative for chest pain and leg swelling.  Gastrointestinal:  Negative for abdominal pain, constipation, diarrhea, nausea and vomiting.  Genitourinary:  Negative for dysuria, flank pain, frequency, genital sores, hematuria and urgency.  Skin:  Negative for rash.  Allergic/Immunologic: Negative for immunocompromised state.  Neurological:  Negative for dizziness and headaches.     Objective:   BP 122/78   Pulse 74   Temp 98.2 F (36.8 C) (Oral)   Ht 5' 9 (1.753 m)   Wt 174 lb (78.9 kg)   SpO2 96%   BMI 25.70 kg/m  Nursing note and vital signs reviewed.  Physical Exam Constitutional:      General: He is not in acute distress.    Appearance: He is well-developed.  Eyes:     Conjunctiva/sclera: Conjunctivae normal.  Cardiovascular:     Rate and Rhythm: Normal rate and regular rhythm.     Heart sounds: Normal heart sounds. No murmur heard.    No friction rub. No gallop.  Pulmonary:     Effort: Pulmonary effort is normal. No respiratory distress.     Breath sounds: Normal breath sounds. No wheezing or rales.  Chest:     Chest wall: No tenderness.  Abdominal:     General: Bowel sounds are normal.     Palpations: Abdomen is soft.     Tenderness: There is no abdominal tenderness.  Musculoskeletal:     Cervical back: Neck supple.  Lymphadenopathy:     Cervical: No cervical adenopathy.  Skin:    General: Skin is warm and dry.     Findings: No rash.  Neurological:     Mental Status: He is alert and oriented to person, place, and time.          01/07/2025    3:58 PM 12/23/2024   10:07 AM 01/10/2017    9:50 AM 04/12/2016    9:12 AM 01/12/2016   11:23 AM  Depression screen PHQ 2/9  Decreased Interest 0 0 0 0 0  Down, Depressed, Hopeless 0 0 0 0 0  PHQ - 2 Score 0 0 0 0 0  Altered sleeping 3 3     Tired, decreased energy 2 2     Change in appetite 2 2     Feeling bad or failure about yourself  0 0      Trouble concentrating 0 0     Moving slowly or fidgety/restless 0 0     Suicidal thoughts 0 0     PHQ-9 Score 7 7     Difficult doing work/chores Not difficult at all Not difficult at all            No data to display           The ASCVD Risk score (Arnett DK, et al., 2019) failed to calculate for the following reasons:   The 2019 ASCVD risk score is  only valid for ages 38 to 29   * - Cholesterol units were assumed      Assessment & Plan:    Patient Active Problem List   Diagnosis Date Noted   HIV disease (HCC) 01/12/2025   Epididymo-orchitis 01/12/2025   High risk social situation 01/12/2025   Autism spectrum disorder 12/24/2024   Asthma exacerbation 09/17/2023   Hypokalemia 09/17/2023   Acute encephalopathy 09/17/2023   Dysuria 08/03/2016   High risk sexual behavior 08/03/2016   Penile lesion 04/12/2016   Urethritis 04/12/2016   Positive hepatitis C antibody test 10/14/2015   Viral illness 04/02/2014   Gynecomastia, male 02/11/2013   Conduct disorder, 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   Metabolic syndrome 07/01/2012   Well adult exam 07/01/2012   HTN (hypertension) 07/01/2012   ATTENTION DEFICIT, W/HYPERACTIVITY 02/13/2007   ASTHMA, UNSPECIFIED 02/13/2007     Problem List Items Addressed This Visit       Genitourinary   Epididymo-orchitis   Incomplete previous treatment having only received ceftriaxone  in the ED and unable to pick up doxycycline . Given that he is a top partner will treat with levofloxacin  to add additional gram negative coverage for a total of 10 days. Will arrange follow up with Urology if symptoms worsen or do not continue to improve.         Other   HIV disease (HCC) - Primary   Dylan Frank is a 39 y/o AA male with newly diagnosed HIV disease with risk factor of MSM with apparent infection acquired within the last year. Initial lab work is pending. Discussed the basics of HIV including transmission, risks if left untreated, treatment  options, lab work, financial assistance, and plan of care. Introduced to engineer, technical sales, counseling, artist, and engineer, materials. Check lab work. Sample of Biktarvy  provided and will send with approved SPAP to pharmacy. Plan for follow up in 1 month or sooner if needed with lab work on the same day.       Relevant Medications   levofloxacin  (LEVAQUIN ) 500 MG tablet   bictegravir-emtricitabine-tenofovir AF (BIKTARVY ) 50-200-25 MG TABS tablet   Other Relevant Orders   Hemoglobin A1c   HIV Antibody (routine testing w rflx)   QuantiFERON-TB Gold Plus   HIV RNA, RTPCR W/R GT (RTI, PI,INT)   T-helper cell (CD4)- ([facility] clinic only)   Urine cytology ancillary only   Cytology (oral, anal, urethral) ancillary only   Cytology (oral, anal, urethral) ancillary only   Hepatitis B surface antigen   Hepatitis B surface antibody,quantitative   Hepatitis B core antibody, total   Hepatitis C antibody   Toxoplasma gondii antibody, IgG   Varicella zoster antibody, IgG   Measles/Mumps/Rubella Immunity   High risk social situation   Behind on rent and having challenges affording bills despite working 2 jobs. Will refer to [facility] for assistance in housing resources and may benefit from case management to connect to additional resources.      Other Visit Diagnoses       Screening for STDs (sexually transmitted diseases)       Relevant Orders   Urine cytology ancillary only   Cytology (oral, anal, urethral) ancillary only   Cytology (oral, anal, urethral) ancillary only     Pharmacologic therapy       Relevant Orders   Lipid panel        I have discontinued Dylan Frank Pseudoeph-CPM-DM-APAP (TYLENOL  COLD & FLU DAY/NIGHT PO). I am also having him start on levofloxacin  and Biktarvy .  Additionally, I am having him maintain his divalproex , albuterol , and divalproex .   Meds ordered this encounter  Medications   levofloxacin  (LEVAQUIN ) 500 MG tablet    Sig: Take 1  tablet (500 mg total) by mouth daily.    Dispense:  10 tablet    Refill:  0    Supervising Provider:   SNIDER, CYNTHIA [[id number]]   bictegravir-emtricitabine-tenofovir AF (BIKTARVY ) 50-200-25 MG TABS tablet    Sig: Take 1 tablet by mouth daily.    Dispense:  30 tablet    Refill:  3    Patient has SPAP    Supervising Provider:   LUIZ CHANNEL [id number]    Prescription Type::   Renewal     Follow-up: Return in about 1 month (around 02/12/2025). or sooner if needed.    Greg Andri Prestia, MSN, FNP-C Nurse Practitioner [facility] Main number: [phone]      [1]  Allergies Allergen Reactions   Mushroom Extract Complex (Obsolete) Swelling   "

## 2025-01-12 NOTE — Progress Notes (Signed)
" ° °  01/12/2025  HPI: Dylan Frank is a 39 y.o. male who presents to the [facility] clinic today to initiate care for a newly diagnosed HIV infection.  Referring ID Provider: Cathlyn July, NP  Patient Active Problem List   Diagnosis Date Noted   Autism spectrum disorder 12/24/2024   Asthma exacerbation 09/17/2023   Hypokalemia 09/17/2023   Acute encephalopathy 09/17/2023   Dysuria 08/03/2016   High risk sexual behavior 08/03/2016   Penile lesion 04/12/2016   Urethritis 04/12/2016   Positive hepatitis C antibody test 10/14/2015   Viral illness 04/02/2014   Gynecomastia, male 02/11/2013   Conduct disorder, 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   Metabolic syndrome 07/01/2012   Well adult exam 07/01/2012   HTN (hypertension) 07/01/2012   ATTENTION DEFICIT, W/HYPERACTIVITY 02/13/2007   ASTHMA, UNSPECIFIED 02/13/2007    Patient's Medications  New Prescriptions   No medications on file  Previous Medications   ALBUTEROL  (VENTOLIN  HFA) 108 (90 BASE) MCG/ACT INHALER    Inhale 2 puffs into the lungs every 6 (six) hours as needed for wheezing or shortness of breath.   DIVALPROEX  (DEPAKOTE ) 125 MG DR TABLET    Take one by mouth at bedtime   DIVALPROEX  (DEPAKOTE ) 125 MG DR TABLET    Take 125 mg by mouth 3 (three) times daily.   PSEUDOEPH-CPM-DM-APAP (TYLENOL  COLD & FLU DAY/NIGHT PO)    Take 1 tablet by mouth 2 (two) times daily as needed (Cold sx).  Modified Medications   No medications on file  Discontinued Medications   No medications on file    Labs: No results found for: HIV1RNAQUANT, HIV1RNAVL, CD4TABS  RPR and STI Lab Results  Component Value Date   LABRPR NON REACTIVE 01/04/2025   LABRPR Reactive (A) 06/15/2017   LABRPR Non Reactive 01/10/2017   LABRPR NON REAC 08/03/2016   LABRPR NON REAC 04/12/2016    STI Results GC CT  06/15/2017 12:00 AM **POSITIVE**  Negative   01/10/2017 12:00 AM **POSITIVE**  **POSITIVE**   08/03/2016 12:00 AM Negative  **POSITIVE**    10/13/2015 12:00 AM **POSITIVE**  Negative     Hepatitis B Lab Results  Component Value Date   HEPBSAG NEGATIVE 08/03/2016   HEPBCAB NON REACTIVE 04/12/2016   Hepatitis C No results found for: HEPCAB, HCVRNAPCRQN Hepatitis A Lab Results  Component Value Date   HAV NON REACTIVE 04/12/2016   Lipids: Lab Results  Component Value Date   CHOL 151 02/11/2013   TRIG 133 02/11/2013   HDL 37 (L) 02/11/2013   CHOLHDL 4.1 02/11/2013   VLDL 27 02/11/2013   LDLCALC 87 02/11/2013    Current HIV Regimen: Treatment naive  Assessment: Dylan Frank is here today to initiate care with Greg Calone, NP for his HIV infection newly diagnosed on 01/04/25.  Dylan Frank is treatment naive with pending viral load and genotype. Will start patient on Biktarvy .  Discussed dosing and what to expect with Biktarvy . Dylan Frank expressed understanding and did not have questions about the medication. Told him the medication would need to be filled with [facility] on [address] for copay to be fully covered. He expresses understanding.   Plan: - Start Biktarvy  - Counseled on Biktarvy  - Labs and follow up as per provider Marny)  Maurilio Patten, PharmD PGY1 Pharmacy Resident [facility] 01/12/2025 7:07 AM "

## 2025-01-12 NOTE — Assessment & Plan Note (Signed)
 Behind on rent and having challenges affording bills despite working 2 jobs. Will refer to [facility] for assistance in housing resources and may benefit from case management to connect to additional resources.

## 2025-01-12 NOTE — Assessment & Plan Note (Signed)
 Incomplete previous treatment having only received ceftriaxone  in the ED and unable to pick up doxycycline . Given that he is a top partner will treat with levofloxacin  to add additional gram negative coverage for a total of 10 days. Will arrange follow up with Urology if symptoms worsen or do not continue to improve.

## 2025-01-12 NOTE — Assessment & Plan Note (Signed)
 Dylan Frank is a 39 y/o AA male with newly diagnosed HIV disease with risk factor of MSM with apparent infection acquired within the last year. Initial lab work is pending. Discussed the basics of HIV including transmission, risks if left untreated, treatment options, lab work, financial assistance, and plan of care. Introduced to engineer, technical sales, counseling, artist, and engineer, materials. Check lab work. Sample of Biktarvy  provided and will send with approved SPAP to pharmacy. Plan for follow up in 1 month or sooner if needed with lab work on the same day.

## 2025-01-12 NOTE — Patient Instructions (Signed)
 Nice to see you.  We will check your lab work today.  Start taking your medication today.   Start the levofloxacin  for 10 days and take until completed.   We will get you connected with [facility] our community partner    Plan for follow up in 1 months or sooner if needed with lab work on the same day.  Have a great day and stay safe!

## 2025-01-13 ENCOUNTER — Other Ambulatory Visit: Payer: Self-pay | Admitting: Pharmacist

## 2025-01-13 DIAGNOSIS — B20 Human immunodeficiency virus [HIV] disease: Secondary | ICD-10-CM

## 2025-01-13 LAB — T-HELPER CELL (CD4) - (RCID CLINIC ONLY)
CD4 % Helper T Cell: 35 % (ref 33–65)
CD4 T Cell Abs: 750 /uL (ref 400–1790)

## 2025-01-13 MED ORDER — BIKTARVY 50-200-25 MG PO TABS: 1.0000 | ORAL_TABLET | Freq: Every day | ORAL | Status: AC

## 2025-01-13 NOTE — Progress Notes (Signed)
 Medication Samples have been provided to the patient.  Drug name: Biktarvy         Strength: 50/200/25 mg       Qty: 7 tablets (1 bottle)   LOT: [id number]   Exp.Date: 01/16/27  Samples requested by Feliciano Close, PharmD.  Dosing instructions: Take one tablet by mouth once daily  The patient has been instructed regarding the correct time, dose, and frequency of taking this medication, including desired effects and most common side effects.   Oluwatobiloba Martin L. Arasely Akkerman, PharmD, BCIDP, AAHIVP, CPP Clinical Pharmacist Practitioner Infectious Diseases Clinical Pharmacist [facility]

## 2025-01-14 LAB — URINE CYTOLOGY ANCILLARY ONLY
Chlamydia: NEGATIVE
Comment: NEGATIVE
Comment: NORMAL
Neisseria Gonorrhea: NEGATIVE

## 2025-01-14 LAB — CYTOLOGY, (ORAL, ANAL, URETHRAL) ANCILLARY ONLY
Chlamydia: NEGATIVE
Comment: NEGATIVE
Comment: NORMAL
Neisseria Gonorrhea: NEGATIVE

## 2025-01-20 LAB — HIV RNA, RTPCR W/R GT (RTI, PI,INT)
HIV 1 RNA Quant: 1610 {copies}/mL — ABNORMAL HIGH
HIV-1 RNA Quant, Log: 3.21 {Log_copies}/mL — ABNORMAL HIGH

## 2025-01-20 LAB — MEASLES/MUMPS/RUBELLA IMMUNITY
Mumps IgG: 45.3 [AU]/ml
Rubella: 3.04 {index}
Rubeola IgG: 220 [AU]/ml

## 2025-01-20 LAB — HIV-1 INTEGRASE GENOTYPE

## 2025-01-20 LAB — QUANTIFERON-TB GOLD PLUS
Mitogen-NIL: 7.21 [IU]/mL
NIL: 0.01 [IU]/mL
QuantiFERON-TB Gold Plus: NEGATIVE
TB1-NIL: 0 [IU]/mL
TB2-NIL: 0 [IU]/mL

## 2025-01-20 LAB — LIPID PANEL
Cholesterol: 123 mg/dL
HDL: 34 mg/dL — ABNORMAL LOW
LDL Cholesterol (Calc): 74 mg/dL
Non-HDL Cholesterol (Calc): 89 mg/dL
Total CHOL/HDL Ratio: 3.6 (calc)
Triglycerides: 70 mg/dL

## 2025-01-20 LAB — HIV ANTIBODY (ROUTINE TESTING W REFLEX)
HIV 1&2 Ab, 4th Generation: REACTIVE
HIV FINAL INTERPRETATION: POSITIVE — AB

## 2025-01-20 LAB — HIV-1 GENOTYPE: HIV-1 Genotype: DETECTED — AB

## 2025-01-20 LAB — HEMOGLOBIN A1C
Hgb A1c MFr Bld: 4.8 %
Mean Plasma Glucose: 91 mg/dL
eAG (mmol/L): 5 mmol/L

## 2025-01-20 LAB — HIV-1/2 AB - DIFFERENTIATION
HIV-1 antibody: POSITIVE — AB
HIV-2 Ab: POSITIVE — AB

## 2025-01-20 LAB — VARICELLA ZOSTER ANTIBODY, IGG: Varicella IgG: 17 {s_co_ratio}

## 2025-01-20 LAB — TOXOPLASMA GONDII ANTIBODY, IGG: Toxoplasma IgG Ratio: <7.2 [IU]/mL

## 2025-01-20 LAB — HEPATITIS B SURFACE ANTIBODY, QUANTITATIVE: Hep B S AB Quant (Post): 405 m[IU]/mL

## 2025-01-20 LAB — HEPATITIS B CORE ANTIBODY, TOTAL: Hep B Core Total Ab: NONREACTIVE

## 2025-01-20 LAB — HEPATITIS B SURFACE ANTIGEN: Hepatitis B Surface Ag: NONREACTIVE

## 2025-01-20 LAB — HEPATITIS C ANTIBODY: Hepatitis C Ab: NONREACTIVE

## 2025-02-09 ENCOUNTER — Ambulatory Visit: Payer: Self-pay | Admitting: Family
